# Patient Record
Sex: Female | Born: 1975 | Race: White | Hispanic: No | Smoking: Former smoker
Health system: Southern US, Community
[De-identification: ages and names within clinical notes are randomized; demographics above are authoritative.]

## PROBLEM LIST (undated history)

## (undated) DIAGNOSIS — D649 Anemia, unspecified: Secondary | ICD-10-CM

## (undated) DIAGNOSIS — E079 Disorder of thyroid, unspecified: Secondary | ICD-10-CM

## (undated) HISTORY — PX: CHOLECYSTECTOMY: SHX55

## (undated) HISTORY — DX: Anemia, unspecified: D64.9

## (undated) HISTORY — PX: NECK SURGERY: SHX720

---

## 1998-05-24 ENCOUNTER — Ambulatory Visit (HOSPITAL_COMMUNITY): Admission: RE | Admit: 1998-05-24 | Discharge: 1998-05-24 | Payer: Self-pay | Admitting: Family Medicine

## 1999-01-23 ENCOUNTER — Other Ambulatory Visit: Admission: RE | Admit: 1999-01-23 | Discharge: 1999-01-23 | Payer: Self-pay | Admitting: Family Medicine

## 1999-03-04 ENCOUNTER — Other Ambulatory Visit: Admission: RE | Admit: 1999-03-04 | Discharge: 1999-03-04 | Payer: Self-pay | Admitting: Family Medicine

## 1999-03-28 ENCOUNTER — Other Ambulatory Visit: Admission: RE | Admit: 1999-03-28 | Discharge: 1999-03-28 | Payer: Self-pay | Admitting: Obstetrics & Gynecology

## 1999-03-31 ENCOUNTER — Ambulatory Visit (HOSPITAL_COMMUNITY): Admission: RE | Admit: 1999-03-31 | Discharge: 1999-03-31 | Payer: Self-pay | Admitting: Family Medicine

## 1999-03-31 ENCOUNTER — Encounter: Payer: Self-pay | Admitting: Family Medicine

## 1999-08-15 ENCOUNTER — Inpatient Hospital Stay (HOSPITAL_COMMUNITY): Admission: AD | Admit: 1999-08-15 | Discharge: 1999-08-18 | Payer: Self-pay | Admitting: Family Medicine

## 1999-11-19 ENCOUNTER — Other Ambulatory Visit: Admission: RE | Admit: 1999-11-19 | Discharge: 1999-11-19 | Payer: Self-pay | Admitting: Obstetrics & Gynecology

## 1999-11-20 ENCOUNTER — Other Ambulatory Visit: Admission: RE | Admit: 1999-11-20 | Discharge: 1999-11-20 | Payer: Self-pay | Admitting: Obstetrics & Gynecology

## 1999-11-20 ENCOUNTER — Encounter (INDEPENDENT_AMBULATORY_CARE_PROVIDER_SITE_OTHER): Payer: Self-pay

## 2000-04-17 ENCOUNTER — Other Ambulatory Visit: Admission: RE | Admit: 2000-04-17 | Discharge: 2000-04-17 | Payer: Self-pay | Admitting: Family Medicine

## 2000-07-14 ENCOUNTER — Other Ambulatory Visit: Admission: RE | Admit: 2000-07-14 | Discharge: 2000-07-14 | Payer: Self-pay | Admitting: Obstetrics & Gynecology

## 2000-07-14 ENCOUNTER — Encounter (INDEPENDENT_AMBULATORY_CARE_PROVIDER_SITE_OTHER): Payer: Self-pay

## 2000-09-15 ENCOUNTER — Ambulatory Visit (HOSPITAL_COMMUNITY): Admission: RE | Admit: 2000-09-15 | Discharge: 2000-09-15 | Payer: Self-pay | Admitting: Obstetrics and Gynecology

## 2000-09-15 ENCOUNTER — Encounter (INDEPENDENT_AMBULATORY_CARE_PROVIDER_SITE_OTHER): Payer: Self-pay

## 2000-09-29 ENCOUNTER — Inpatient Hospital Stay (HOSPITAL_COMMUNITY): Admission: AD | Admit: 2000-09-29 | Discharge: 2000-09-29 | Payer: Self-pay | Admitting: Obstetrics and Gynecology

## 2001-02-15 ENCOUNTER — Other Ambulatory Visit: Admission: RE | Admit: 2001-02-15 | Discharge: 2001-02-15 | Payer: Self-pay | Admitting: Family Medicine

## 2001-09-27 ENCOUNTER — Other Ambulatory Visit: Admission: RE | Admit: 2001-09-27 | Discharge: 2001-09-27 | Payer: Self-pay | Admitting: Family Medicine

## 2003-02-01 ENCOUNTER — Encounter: Payer: Self-pay | Admitting: Family Medicine

## 2003-02-01 ENCOUNTER — Encounter: Admission: RE | Admit: 2003-02-01 | Discharge: 2003-02-01 | Payer: Self-pay | Admitting: Family Medicine

## 2003-02-26 ENCOUNTER — Other Ambulatory Visit: Admission: RE | Admit: 2003-02-26 | Discharge: 2003-02-26 | Payer: Self-pay | Admitting: Family Medicine

## 2003-09-13 ENCOUNTER — Ambulatory Visit (HOSPITAL_COMMUNITY): Admission: RE | Admit: 2003-09-13 | Discharge: 2003-09-13 | Payer: Self-pay | Admitting: Family Medicine

## 2003-11-22 ENCOUNTER — Other Ambulatory Visit: Admission: RE | Admit: 2003-11-22 | Discharge: 2003-11-22 | Payer: Self-pay | Admitting: Family Medicine

## 2003-12-24 ENCOUNTER — Ambulatory Visit (HOSPITAL_COMMUNITY): Admission: RE | Admit: 2003-12-24 | Discharge: 2003-12-24 | Payer: Self-pay | Admitting: Family Medicine

## 2004-04-25 ENCOUNTER — Inpatient Hospital Stay (HOSPITAL_COMMUNITY): Admission: AD | Admit: 2004-04-25 | Discharge: 2004-04-27 | Payer: Self-pay | Admitting: Family Medicine

## 2004-07-29 ENCOUNTER — Emergency Department (HOSPITAL_COMMUNITY): Admission: EM | Admit: 2004-07-29 | Discharge: 2004-07-29 | Payer: Self-pay | Admitting: Emergency Medicine

## 2004-09-09 ENCOUNTER — Emergency Department (HOSPITAL_COMMUNITY): Admission: EM | Admit: 2004-09-09 | Discharge: 2004-09-09 | Payer: Self-pay | Admitting: Emergency Medicine

## 2004-10-07 ENCOUNTER — Observation Stay (HOSPITAL_COMMUNITY): Admission: RE | Admit: 2004-10-07 | Discharge: 2004-10-08 | Payer: Self-pay | Admitting: General Surgery

## 2006-04-22 ENCOUNTER — Emergency Department (HOSPITAL_COMMUNITY): Admission: EM | Admit: 2006-04-22 | Discharge: 2006-04-22 | Payer: Self-pay | Admitting: Emergency Medicine

## 2006-10-01 ENCOUNTER — Ambulatory Visit (HOSPITAL_COMMUNITY): Admission: RE | Admit: 2006-10-01 | Discharge: 2006-10-01 | Payer: Self-pay | Admitting: Family Medicine

## 2007-04-01 ENCOUNTER — Emergency Department (HOSPITAL_COMMUNITY): Admission: EM | Admit: 2007-04-01 | Discharge: 2007-04-01 | Payer: Self-pay | Admitting: Emergency Medicine

## 2007-09-07 ENCOUNTER — Emergency Department (HOSPITAL_COMMUNITY): Admission: EM | Admit: 2007-09-07 | Discharge: 2007-09-07 | Payer: Self-pay | Admitting: Emergency Medicine

## 2008-03-25 ENCOUNTER — Emergency Department (HOSPITAL_COMMUNITY): Admission: EM | Admit: 2008-03-25 | Discharge: 2008-03-25 | Payer: Self-pay | Admitting: Emergency Medicine

## 2008-03-28 ENCOUNTER — Ambulatory Visit (HOSPITAL_COMMUNITY): Admission: RE | Admit: 2008-03-28 | Discharge: 2008-03-28 | Payer: Self-pay | Admitting: Family Medicine

## 2008-07-09 ENCOUNTER — Emergency Department (HOSPITAL_COMMUNITY): Admission: EM | Admit: 2008-07-09 | Discharge: 2008-07-09 | Payer: Self-pay | Admitting: Emergency Medicine

## 2008-07-25 ENCOUNTER — Other Ambulatory Visit: Admission: RE | Admit: 2008-07-25 | Discharge: 2008-07-25 | Payer: Self-pay | Admitting: Obstetrics and Gynecology

## 2008-08-01 ENCOUNTER — Ambulatory Visit (HOSPITAL_COMMUNITY): Admission: RE | Admit: 2008-08-01 | Discharge: 2008-08-01 | Payer: Self-pay | Admitting: Obstetrics and Gynecology

## 2008-08-07 ENCOUNTER — Ambulatory Visit (HOSPITAL_COMMUNITY): Admission: RE | Admit: 2008-08-07 | Discharge: 2008-08-07 | Payer: Self-pay | Admitting: Obstetrics and Gynecology

## 2008-09-12 ENCOUNTER — Ambulatory Visit (HOSPITAL_COMMUNITY): Admission: RE | Admit: 2008-09-12 | Discharge: 2008-09-12 | Payer: Self-pay | Admitting: Family Medicine

## 2008-10-31 ENCOUNTER — Ambulatory Visit (HOSPITAL_COMMUNITY): Admission: RE | Admit: 2008-10-31 | Discharge: 2008-10-31 | Payer: Self-pay | Admitting: Family Medicine

## 2009-01-18 ENCOUNTER — Ambulatory Visit (HOSPITAL_COMMUNITY): Admission: RE | Admit: 2009-01-18 | Discharge: 2009-01-18 | Payer: Self-pay | Admitting: Family Medicine

## 2009-01-31 ENCOUNTER — Ambulatory Visit (HOSPITAL_COMMUNITY): Admission: RE | Admit: 2009-01-31 | Discharge: 2009-01-31 | Payer: Self-pay | Admitting: Family Medicine

## 2009-05-13 ENCOUNTER — Inpatient Hospital Stay (HOSPITAL_COMMUNITY): Admission: RE | Admit: 2009-05-13 | Discharge: 2009-05-14 | Payer: Self-pay | Admitting: Neurosurgery

## 2009-06-12 ENCOUNTER — Emergency Department (HOSPITAL_COMMUNITY): Admission: EM | Admit: 2009-06-12 | Discharge: 2009-06-12 | Payer: Self-pay | Admitting: Emergency Medicine

## 2009-08-17 HISTORY — PX: TUBAL LIGATION: SHX77
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
## 2010-11-20 LAB — COMPREHENSIVE METABOLIC PANEL
ALT: 14 U/L (ref 0–35)
Alkaline Phosphatase: 80 U/L (ref 39–117)
CO2: 26 mEq/L (ref 19–32)
GFR calc non Af Amer: >60 mL/min (ref >60)
Glucose, Bld: 103 mg/dL — ABNORMAL HIGH (ref 70–99)
Potassium: 3.6 mEq/L (ref 3.5–5.1)
Sodium: 138 mEq/L (ref 135–145)

## 2010-11-20 LAB — CBC
HCT: 38.5 % (ref 36.0–46.0)
Hemoglobin: 13.6 g/dL (ref 12.0–15.0)
MCV: 90.3 fL (ref 78.0–100.0)
Platelets: 226 10*3/uL (ref 150–400)
RBC: 4.27 MIL/uL (ref 3.87–5.11)

## 2010-11-20 LAB — GC/CHLAMYDIA PROBE AMP, GENITAL
Chlamydia, DNA Probe: NEGATIVE
GC Probe Amp, Genital: NEGATIVE

## 2010-11-20 LAB — URINALYSIS, ROUTINE W REFLEX MICROSCOPIC
Ketones, ur: 15 mg/dL — AB
Specific Gravity, Urine: 1.025 (ref 1.005–1.030)

## 2010-11-20 LAB — DIFFERENTIAL
Basophils Relative: 1 % (ref 0–1)
Eosinophils Absolute: 0 10*3/uL (ref 0.0–0.7)
Neutrophils Relative %: 61 % (ref 43–77)

## 2010-11-20 LAB — URINE MICROSCOPIC-ADD ON

## 2010-11-20 LAB — WET PREP, GENITAL

## 2010-11-21 LAB — BASIC METABOLIC PANEL
BUN: 7 mg/dL (ref 6–23)
Calcium: 9 mg/dL (ref 8.4–10.5)
GFR calc non Af Amer: >60 mL/min (ref >60)
Glucose, Bld: 97 mg/dL (ref 70–99)
Potassium: 3.7 mEq/L (ref 3.5–5.1)

## 2010-11-21 LAB — CBC
HCT: 38.1 % (ref 36.0–46.0)
MCHC: 34.3 g/dL (ref 30.0–36.0)
Platelets: 250 10*3/uL (ref 150–400)
RDW: 12.4 % (ref 11.5–15.5)

## 2010-12-30 NOTE — H&P (Signed)
NAME:  Donna Lynch, Donna Lynch NO.:  [id number]   MEDICAL RECORD NO.:  [id number]          PATIENT TYPE:  AMB   LOCATION:  DAY                           FACILITY:  [facility]   PHYSICIAN:  Tilda Burrow, M.D. DATE OF BIRTH:  1976/01/30   DATE OF ADMISSION:  DATE OF DISCHARGE:  LH                              HISTORY & PHYSICAL   ADMISSION DIAGNOSIS:  Pregnancy, [redacted] weeks gestation, missed abortion.   HISTORY OF PRESENT ILLNESS:  This 35 year old female gravida 5, para 3-0-  1-2, LMP 05/25/2008 placing EDC 02/19/2009 is seen for suction D&C and  tubal ligation on 08/02/2008.  Vianny has been seen in our office for  evaluation of an early unplanned pregnancy.  Ultrasound showed a  nonviable pregnancy with an intrauterine gestational sac.  The patient  was given the option of continued followup until bleeding begins and  hopeful spontaneous passage of tissue.  Options of proceeding with a D&C  are similarly discussed.  The patient additionally reports that she  desires to proceed with permanent sterilization.  She is insured and at  78 is informed of the permanency of requesting the procedure and desires  to proceed at this time.  Plans are to proceed with suction dilation and  curettage followed by laparoscopic tubal sterilization with Falope  rings, both to be performed Thursday, 08/02/2008 at 10:30 a.m.   PAST MEDICAL HISTORY:  Positive for heart palpitations at age 40, not  currently a problem.   PAST SURGICAL HISTORY:  Cholecystectomy.   ALLERGIES:  Penicillin causes an unknown reaction.   MEDICATIONS:  None.   HABITS:  Cigarettes, 6 per day.  Alcohol denied.  Recreational drugs  denied.   The patient is employed at [employer]   FAMILY HISTORY:  Positive for diabetes and ovarian cancer.   GYN HISTORY AND OB HISTORY:  She is a gravida 5, para 3-0-1-2 who one  child who died at age 35 from drowning with two term vaginal deliveries  at [facility] in 2000 and  2005.  She desires no further children.   PHYSICAL EXAMINATION:  Reveals a petite Caucasian female, alert and  oriented times three.  Height 5 feet 1 inches, weight 154, blood pressure 130/60, pulse 70s.  HEENT:  Pupils equal, round, and reactive.  NECK:  Supple.  CHEST:  Clear to auscultation.  ABDOMEN:  Nontender.  External genitalia:  Multiparous.  Uterus nontender, anteflexed, 6 to 8  week size with ultrasound confirmation of an intrauterine gestational  sac and absence of fetal heart motion.   PLAN:  Suction dilation and curettage to be performed on 08/02/2008.   Addendum: Due to suspected Bronchitis, patient is postponed for 5 days  and given oral antibiotics, Azithromycin.    Patient report , on 08/06/2008, that she is better and not coughing  any more. CXR was normal last week.      Tilda Burrow, M.D.  Electronically Signed     JVF/MEDQ  D:  08/01/2008  T:  08/01/2008  Job:  [id number]   cc:   [facility]

## 2010-12-30 NOTE — Op Note (Signed)
NAME:  Donna Lynch, Donna Lynch NO.:  [id number]   MEDICAL RECORD NO.:  [id number]          PATIENT TYPE:  AMB   LOCATION:  DAY                           FACILITY:  [facility]   PHYSICIAN:  Tilda Burrow, M.D. DATE OF BIRTH:  1976-05-03   DATE OF PROCEDURE:  DATE OF DISCHARGE:                               OPERATIVE REPORT   PREOPERATIVE DIAGNOSES:  1. Missed abortion.  2. Desire for elective sterilization.   POSTOPERATIVE DIAGNOSES:  1. Missed abortion.  2. Desire for elective sterilization.  3. Umbilical hernia.   PROCEDURES:  1. Suction dilation and curettage.  2. Laparoscopic tubal sterilization, Falope ring.  3. Umbilical herniorrhaphy.   SURGEON:  Tilda Burrow, MD   ASSISTANT:  Amie Critchley, CST   ANESTHESIA:  General with endotracheal intubation./Theresa Ophelia Charter, CRNA.   COMPLICATIONS:  None.   FINDINGS:  Diffusely enlarged uterus consistent with fibroid changes in  the uterus.  Empty sac and placenta consistent with missed AB 3, 1.5 cm  asymptomatic umbilical hernia, wanting repair as part of the surgical  procedure.   DETAILS OF THE PROCEDURE:  The patient was taken to the operating room,  prepped and draped in the usual fashion for a combined abdominal and  vaginal procedure.  First portion of the procedure was the [facility].   With legs in the low-lithotomy position, the speculum was inserted and  vaginal apex prepped using Betadine.  Anterior lip of the cervix was  grasped with single-tooth tenaculum and paracervical block using 18 mL  of Marcaine solution was performed.  The uterus was then sounded to 12  cm.  Preprocedure examination while the patient was asleep had shown the  uterus to be diffusely enlarged and well supported, consistent with 10-  week size.  Uterus was sounded in the anteflexed position to 11 cm,  dilated to 31 [place] allowing introduction through the curved 10-mm  suction curette, which was placed to 45 mm suction and removed fluid  tissue, all clots, and products of conception.  IV oxytocin was infused  to encourage uterine contraction.  A sharp curettage of all 4 quadrants  confirmed a large firm, thick uterus with satisfactory evacuation of the  endometrial cavity.  Repeat suction curettage confirmed that the  endometrial cavity was empty.  The specimen was sent to Pathology.   TUBAL LIGATION:  We then prepped for tubal ligation.  A left uterine  manipulator was attached to the cervix for uterine manipulation and in-  and-out catheterization of the bladder was performed sterilely.  Abdomen  was prepped and draped.  A mid umbilical 1-cm skin incision was made  after we could feel the umbilicus and tell that there was a 1.5-cm  umbilical hernia with a firm rim.  The edge of the fascia could be  elevated with an Allis clamp and the remaining preperitoneal fat and  peritoneum were easily entered with a Veress needle and pneumoperitoneum  achieved under 4 mm mercury of pressure.  Attention was then directed to  the pelvis using laparoscopic camera and the suprapubic trocar was  introduced under direct visualization.  The attention was directed to  the pelvis and the uterus was firm, diffusely enlarged, consistent with  fibroid enlargement of the uterus.  There was no endometriosis.  No  ovarian abnormalities appreciable.  Tubes were grossly normal.  The left  tube could be identified to its fimbriated end, was elevated, and the  Falope-ring applicator used to attach a ring to the mid portion of the  left fallopian tube with the area around the ring infiltrated  percutaneously with Marcaine solution.  The opposite tube was treated in  similar fashion and photo documentation performed.  Deflation of the  abdomen followed.  A 60 mL of saline was placed in the abdomen to help  with evacuation of carbon dioxide.   UMBILICAL HERNIA REPAIR:  Umbilical hernia repair consisted of grasping  the fascial edges above and below the  umbilicus, and pulling together  with 3 sutures of 2-0 Prolene tied in an interrupted fashion with the  knot buried beneath the fascia.  The patient tolerated the procedure  well.  The subcu closure of the skin incision then followed with  subcuticular 4-0 Dexon interrupted sutures x3 for the umbilicus and x1  in the suprapubic site.  Sponge and needle counts were correct.  The  patient went to recovery room in stable condition.      Tilda Burrow, M.D.  Electronically Signed     JVF/MEDQ  D:  08/07/2008  T:  08/08/2008  Job:  [id number]   cc:   Philipp Ovens OBN/GYN

## 2011-01-02 NOTE — Op Note (Signed)
[facility] [place]  Patient:    Donna Lynch, Donna Lynch                        MRN: [id number] Proc. Date: 09/15/00 Adm. Date:  [id number] Attending:  Dierdre Forth Pearline                           Operative Report  PREOPERATIVE DIAGNOSIS:       High grade squamous intraepithelial lesion.  POSTOPERATIVE DIAGNOSIS:      High grade squamous intraepithelial lesion.  OPERATION:                    Loop electrosurgical excision procedure of the                               cervix.  SURGEON:                      Vanessa P. Pennie Rushing, M.D.  ANESTHESIA:                   Local.  ESTIMATED BLOOD LOSS:         Less than 100 cc.  COMPLICATIONS:                None.  FINDINGS:                     There was a large Lugols nonstaining area in the transition zone which was excised at the time of LEEP conization.  DESCRIPTION OF PROCEDURE:     The patient was taken to the operating room and placed on the operating table. She was placed in the lithotomy position. The perineum was prepped with multiple layers of Betadine and draped. A laser speculum was placed in the vagina and colposcopy performed. A paracervical block was achieved first with a total of 10 cc of 2% Xylocaine in the 5 and 7 oclock positions, but when no relief of pain was noted, another 10 cc of 2% Xylocaine was placed in the same positions. After waiting an appropriate period of time, it was noted the patient still did not have adequate relief and she was then given a local with a total of 10 cc 0.25% Marcaine with epinephrine which allowed for good relief. The cervix was then infiltrated with Pitressin. A 20 x 10 loop was used to excised a cone-shaped portion of the transition zone. This was pinned on a corkboard. Hemostasis was achieved with several sutures of 0 Vicryl and cautery to the conization bed. A gel foam pack was placed in the conization bed and hemostasis noted to be adequate. The patient was  taken from the operating room to the recovery room is satisfactory condition having tolerated the procedure well with sponge and instrument counts correct.  SPECIMEN TO PATHOLOGY:        LEEP conization sent fresh. DD:  09/15/00 TD:  09/15/00 Job: [id number]

## 2011-01-02 NOTE — Op Note (Signed)
[facility] [place]  Patient:    Donna Lynch, Donna Lynch                        MRN: [id number] Proc. Date: 09/29/00 Adm. Date:  [id number] Disc. Date: [id number] Attending:  Dierdre Forth Pearline                           Operative Report  PREOPERATIVE DIAGNOSES:       1. Status post loop electrosurgical excision                                  procedure on September 15, 2000.                               2. Cervical bleeding.  POSTOPERATIVE DIAGNOSES:      1. Status post loop electrosurgical excision                                  procedure on September 15, 2000.                               2. Cervical bleeding.  PROCEDURE:                    Multiple suturing of the cervix.  SURGEON:                      Janine Limbo, M.D.  ANESTHESIA:                   Local Xylocaine.  INDICATIONS:                  The patient is a 35 year old female, para 3-0-1-3 who underwent a LEEP procedure on September 15, 2000 because of a high grade squamous intraepithelial lesion.  The patient tolerated her procedure well.  The patient was doing quite well and, in fact, was seen in the office on September 29, 2000 for a postoperative visit.  At that point, she was having to problems whatsoever.  The patient reports that she did have a headache today and that she took "Goodys" powders (contains aspirin).  She began having heavy vaginal bleeding with clots.  She presented to the emergency department because of her bleeding.  She reports that she has had some slight dizziness.  FINDINGS:                     The patient was noted to have significant bleeding from the 7 oclock position of the cervix.  There was no evidence of infection.  Her hemoglobin was 12.5.  Her white blood cell count was 5600. Orthostatic blood pressures and pulses were obtained and they were largely within normal limits except that her pulse did increase from 98 lying to 104 sitting to 136  standing.  DESCRIPTION OF PROCEDURE:     The patient was seen in the emergency department and she was examined.  She was placed in the lithotomy position.  The cervix was carefully inspected and she was noted to have bleeding at the 7 oclock position.  The cervix was injected with a total  of about 30 cc of a% Xylocaine without epinephrine.  A total of four 0 Vicryl sutures were placed in the cervix.  Pressure was placed on the cervix and Gelfoam was also placed in the crater from where the leak was performed.  Pressure was then able to maintain hemostasis.  The patient was able to tolerate her procedure well.  Estimated blood loss was 75 cc.  She was returned to the supine position.  FOLLOW-UP INFORMATION:        The patient will return in 1-2 weeks for follow-up examination.  She was told to expect some spotting, but to call fro bleeding and clotting.  She will be observed in the emergency department and she will be given food.  If she is stable, then she will be allowed to go home.  Otherwise, she will be observed overnight. DD:  09/29/00 TD:  09/30/00 Job: [id number]

## 2011-01-02 NOTE — H&P (Signed)
Donna Lynch, Donna Lynch        ACCOUNT NO.:  [id number]   MEDICAL RECORD NO.:  [id number]          PATIENT TYPE:  INP   LOCATION:  A330                          FACILITY:  [facility]   PHYSICIAN:  Dirk Dress. Katrinka Blazing, M.D.   DATE OF BIRTH:  [birth year]   DATE OF ADMISSION:  DATE OF DISCHARGE:  LH                                HISTORY & PHYSICAL   HISTORY OF PRESENT ILLNESS:  A 35 year old female with a 3-month history of  general malaise with epigastric and back pain, with episodic nausea and  vomiting.  The patient was symptomatic basically every day.  During her work-  up, she was found to have numerous gallstones filling her entire  gallbladder.  She remains severely symptomatic, and is having uncontrolled  pain.  She is scheduled for laparoscopic cholecystectomy.   PAST HISTORY:   MEDICATIONS:  She is not on any medications.   PAST SURGICAL HISTORY:  She has not had previous surgery.   ALLERGIES:  PENICILLIN.   SOCIAL HISTORY:  She is married.  She is employed.  She has 3 children.   REVIEW OF SYSTEMS:  Positive for constant bleeding vaginally as a result of  Depo-Provera.   PHYSICAL EXAMINATION:  VITAL SIGNS:  Blood pressure 110/60, pulse 80,  respirations 18, weight 146 pounds.  HEENT:  Unremarkable.  NECK:  Supple without JVD or bruit.  CHEST:  Clear to auscultation.  HEART:  Regular rate and rhythm without murmur, gallop, or rub.  ABDOMEN:  Soft.  There is epigastric tenderness and left lower quadrant  tenderness.  No masses.  EXTREMITIES:  No clubbing, cyanosis, or edema.  NEUROLOGIC:  No focal motor, sensory, or cerebellar deficits.   IMPRESSION:  Cholelithiasis with cholecystitis.   PLAN:  Laparoscopic cholecystectomy.      LCS/MEDQ  D:  10/06/2004  T:  10/07/2004  Job:  [id number]

## 2011-01-02 NOTE — Op Note (Signed)
NAME:  Donna Lynch, Donna Lynch        ACCOUNT NO.:  [id number]   MEDICAL RECORD NO.:  [id number]          PATIENT TYPE:  AMB   LOCATION:  DAY                           FACILITY:  [facility]   PHYSICIAN:  Jerolyn Shin C. Katrinka Blazing, M.D.   DATE OF BIRTH:  09-11-1975   DATE OF PROCEDURE:  10/07/2004  DATE OF DISCHARGE:                                 OPERATIVE REPORT   PREOPERATIVE DIAGNOSIS:  Cholelithiasis, cholecystitis.   POSTOPERATIVE DIAGNOSIS:  Cholelithiasis, cholecystitis.   PROCEDURE:  Laparoscopic cholecystectomy.   SURGEON:  Dirk Dress. Katrinka Blazing, M.D.   DESCRIPTION:  Under general anesthesia, the patient's abdomen was prepped  and draped in a sterile field.  A supraumbilical midline incision was made.  The Veress needle was inserted uneventfully.  Abdomen was insufflated with  2.5 L of CO2.  Using a Visiport guide, a 10 mm port was placed.  The  laparoscope was placed.  Under videoscopic guidance, a 10 mm port with two 5  mm ports were placed in the right subcostal region.  The gallbladder was  grasped and placed on traction.  The cystic duct was dissected, clipped with  five clips and divided.  The cystic artery had two branches.  Each was  clipped with three clips and divided close to the gallbladder.  Using  electrocautery, the gallbladder was separated from the hepatic bed without  difficulty.  It was placed in an EndoCatch device and retrieved.  Irrigation  was carried out until the fluid returned clear.  There was no bleeding from  the bed of the liver.  The scope was removed from the supraumbilical area  and placed in the midsubcostal area.  Evaluation of the left colon and the  left lower quadrant revealed no abnormality except for some hard stool in  the colon.  Pelvic organs appeared to be normal, especially the left tube  and ovary.  The right tube and ovary also appeared to be normal, as was the  uterus.  There were no adhesions in the pelvis.  There was no evidence of  inflammation.   The CO2 was allowed to escape from the abdomen and the ports  were removed.  The fascia of the supraumbilical incision was closed with 0  Vicryl.  All incisions were closed with staples on the skin.  The patient  tolerated the procedure well.  Dressings were placed.  She was awakened from  anesthesia uneventfully and transferred to a bed and taken to the  postanesthesic care unit for further monitoring.      LCS/MEDQ  D:  10/07/2004  T:  10/07/2004  Job:  [id number]

## 2011-05-22 LAB — CBC
HCT: 35.6 % — ABNORMAL LOW (ref 36.0–46.0)
MCHC: 35 g/dL (ref 30.0–36.0)
MCV: 93 fL (ref 78.0–100.0)
RBC: 3.83 MIL/uL — ABNORMAL LOW (ref 3.87–5.11)

## 2011-05-22 LAB — RHOGAM INJECTION

## 2018-04-19 ENCOUNTER — Other Ambulatory Visit (HOSPITAL_COMMUNITY): Payer: Self-pay | Admitting: *Deleted

## 2018-04-19 DIAGNOSIS — Z1231 Encounter for screening mammogram for malignant neoplasm of breast: Secondary | ICD-10-CM

## 2018-04-25 ENCOUNTER — Encounter (HOSPITAL_COMMUNITY): Payer: Self-pay

## 2018-04-25 ENCOUNTER — Ambulatory Visit (HOSPITAL_COMMUNITY)
Admission: RE | Admit: 2018-04-25 | Discharge: 2018-04-25 | Disposition: A | Discharge status: Home / Self Care | Payer: PRIVATE HEALTH INSURANCE | Source: Ambulatory Visit | Attending: *Deleted | Admitting: *Deleted

## 2018-04-25 DIAGNOSIS — Z1231 Encounter for screening mammogram for malignant neoplasm of breast: Secondary | ICD-10-CM | POA: Insufficient documentation

## 2018-08-17 DIAGNOSIS — E039 Hypothyroidism, unspecified: Secondary | ICD-10-CM

## 2018-08-17 HISTORY — DX: Hypothyroidism, unspecified: E03.9

## 2019-12-15 ENCOUNTER — Other Ambulatory Visit: Payer: Self-pay

## 2019-12-15 ENCOUNTER — Telehealth: Payer: Self-pay

## 2019-12-15 ENCOUNTER — Ambulatory Visit
Admission: EM | Admit: 2019-12-15 | Discharge: 2019-12-15 | Disposition: A | Discharge status: Home / Self Care | Payer: BC Managed Care – PPO

## 2019-12-15 DIAGNOSIS — M79671 Pain in right foot: Secondary | ICD-10-CM | POA: Diagnosis not present

## 2019-12-15 DIAGNOSIS — Z0289 Encounter for other administrative examinations: Secondary | ICD-10-CM

## 2019-12-15 DIAGNOSIS — M79672 Pain in left foot: Secondary | ICD-10-CM | POA: Diagnosis not present

## 2019-12-15 HISTORY — DX: Disorder of thyroid, unspecified: E07.9

## 2019-12-15 MED ORDER — PREDNISONE 20 MG PO TABS: 20.0000 mg | ORAL_TABLET | Freq: Two times a day (BID) | ORAL | 0 refills | Status: AC

## 2019-12-15 MED ORDER — PREDNISONE 20 MG PO TABS: 20.0000 mg | ORAL_TABLET | Freq: Two times a day (BID) | ORAL | 0 refills | Status: DC

## 2019-12-15 NOTE — Discharge Instructions (Signed)
Work note given Continue conservative management of rest, ice, and gentle stretches; try freezing a water bottle and stretching bottle of foot over water bottle Prednisone prescribed.  Take as directed and to completion Follow up with PCP or orthopedist for further evaluation and management Return or go to the ER if you have any new or worsening symptoms (fever, chills, redness, swelling, bruising, worsening symptoms despite medication/ treatment, etc...)

## 2019-12-15 NOTE — ED Provider Notes (Signed)
[facility]   WS:[id number] 12/15/19 Arrival Time: 0904  CC: Encounter to obtain work note  SUBJECTIVE: History from: patient. Donna HOUSEN is a 44 y.o. female complains of bilateral foot pain x 3 months.  Denies a precipitating event or specific injury.  Reports standing on her feet for 11 hours at time.  Localizes the pain to the heels.  Describes the pain as intermittent and achy in character.  Has tried OTC medications without relief.  Symptoms are made worse with standing.  Denies similar symptoms in the past.  Denies fever, chills, erythema, ecchymosis, effusion, weakness.    ROS: As per HPI.  All other pertinent ROS negative.     Past Medical History:  Diagnosis Date  . Thyroid disease    History reviewed. No pertinent surgical history. Allergies  Allergen Reactions  . Penicillins    No current facility-administered medications on file prior to encounter.   Current Outpatient Medications on File Prior to Encounter  Medication Sig Dispense Refill  . cholecalciferol (VITAMIN D3) 25 MCG (1000 UNIT) tablet Take 1,000 Units by mouth daily.    . ferrous sulfate 325 (65 FE) MG EC tablet Take 325 mg by mouth 3 (three) times daily with meals.    Marland Kitchen levothyroxine (SYNTHROID) 100 MCG tablet Take 100 mcg by mouth daily before breakfast.     Social History   Socioeconomic History  . Marital status: Legally Separated    Spouse name: Not on file  . Number of children: Not on file  . Years of education: Not on file  . Highest education level: Not on file  Occupational History  . Not on file  Tobacco Use  . Smoking status: Former Research scientist (life sciences)  . Smokeless tobacco: Never Used  Substance and Sexual Activity  . Alcohol use: Yes    Comment: socially  . Drug use: Never  . Sexual activity: Not on file  Other Topics Concern  . Not on file  Social History Narrative  . Not on file   Social Determinants of Health   Financial Resource Strain:   . Difficulty of Paying Living  Expenses:   Food Insecurity:   . Worried About Charity fundraiser in the Last Year:   . Arboriculturist in the Last Year:   Transportation Needs:   . Film/video editor (Medical):   Marland Kitchen Lack of Transportation (Non-Medical):   Physical Activity:   . Days of Exercise per Week:   . Minutes of Exercise per Session:   Stress:   . Feeling of Stress :   Social Connections:   . Frequency of Communication with Friends and Family:   . Frequency of Social Gatherings with Friends and Family:   . Attends Religious Services:   . Active Member of Clubs or Organizations:   . Attends Archivist Meetings:   Marland Kitchen Marital Status:   Intimate Partner Violence:   . Fear of Current or Ex-Partner:   . Emotionally Abused:   Marland Kitchen Physically Abused:   . Sexually Abused:    Family History  Problem Relation Age of Onset  . Healthy Mother   . Healthy Father     OBJECTIVE:  Vitals:   12/15/19 0917  BP: 128/87  Pulse: 78  Resp: 19  Temp: 98.4 F (36.9 C)  SpO2: 97%    General appearance: ALERT; in no acute distress.  Head: NCAT Lungs: Normal respiratory effort CV: Dorsalis pedis pulses 2+ bilaterally. Cap refill < 2 seconds Musculoskeletal:  Bilateral feet Inspection: Skin warm, dry, clear and intact without obvious erythema, effusion, or ecchymosis.  Palpation: Mild diffuse TTP over bilateral heels ROM: FROM active and passive Skin: warm and dry Neurologic: Ambulates without difficulty; Sensation intact about the lower extremities Psychological: alert and cooperative; normal mood and affect  ASSESSMENT & PLAN:  1. Bilateral foot pain   2. Encounter to obtain excuse from work     Meds ordered this encounter  Medications  . predniSONE (DELTASONE) 20 MG tablet    Sig: Take 1 tablet (20 mg total) by mouth 2 (two) times daily with a meal for 5 days.    Dispense:  10 tablet    Refill:  0    Order Specific Question:   Supervising Provider    Answer:   Raylene Everts [id number]    Work note given Continue conservative management of rest, ice, and gentle stretches; try freezing a water bottle and stretching bottle of foot over water bottle Prednisone prescribed.  Take as directed and to completion Follow up with PCP or orthopedist for further evaluation and management Return or go to the ER if you have any new or worsening symptoms (fever, chills, redness, swelling, bruising, worsening symptoms despite medication/ treatment, etc...)     Reviewed expectations re: course of current medical issues. Questions answered. Outlined signs and symptoms indicating need for more acute intervention. Patient verbalized understanding. After Visit Summary given.    Lestine Box, PA-C 12/15/19 [phone]

## 2019-12-15 NOTE — ED Triage Notes (Signed)
Pt presents with complaints of feet pain x 3 months that is worsening over time. Reports having a job where she is on her feet for 11 hours. States she is having pain other places in her body like in her wrists. States she stopped taking her vitamin d and is unsure if that's why. Pt is asking for a dr note until she can get in to see her pcp.

## 2020-05-01 ENCOUNTER — Telehealth: Payer: Self-pay | Admitting: Allergy & Immunology

## 2020-05-01 NOTE — Telephone Encounter (Signed)
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 Patient had tingling in her tongue 20 minutes after getting the shot. 10 minutes later her throat had pressure like it was closing up. Patient was giving Benadryl and was fine other than a headache. Patient says she has reactions after pineapple and wine and she has had the same reaction since the shot of red cheeks and small bumps on her cheeks.   Please advise if covid component testing is needed.

## 2020-05-01 NOTE — Telephone Encounter (Signed)
I do not think that component testing is needed.  I would recommend that she premedicate with Zyrtec 1 to 2 tablets 30 minutes before the next injection.  If she is very nervous, we can do the Covid vaccine in our office.  We are doing a Covid shot clinic in [place] on September 29.  Salvatore Marvel, MD [facility] [place]

## 2020-05-01 NOTE — Telephone Encounter (Signed)
Left a message for patient to call the office in regards to this matter. 

## 2020-05-01 NOTE — Telephone Encounter (Signed)
Please advise 

## 2020-05-02 NOTE — Telephone Encounter (Signed)
Please advice Dr. Ernst Bowler

## 2020-05-02 NOTE — Telephone Encounter (Signed)
Patient called back and was informed of Dr. Gillermina Hu recommendation. Patient verbalized understanding and was okay with it. Patient wanted to inform that her cheeks are still red with bumps on them like she is still having an allergic reaction (its been 7 days since first shot). Patient said when she drinks wine or Sundrop her cheeks get red and bumpy like that, but it goes away after a few hours.  Please advise.

## 2020-05-03 NOTE — Telephone Encounter (Signed)
Left message for patient to call back and advise we would like to receive a picture of her reactions so that we can decide what medication to send to her specific pharmacy.

## 2020-05-03 NOTE — Telephone Encounter (Signed)
Can she send pictures?  We could send in something like Eucrisa or hydrocortisone 2.5% to apply to see if that helps.  Salvatore Marvel, MD [facility] [place]

## 2020-05-08 NOTE — Telephone Encounter (Signed)
Attempted to call patient again and number is no longer in service.... Patient will need to schedule a new patient appointment for further evaluation and treatments.

## 2020-05-17 ENCOUNTER — Telehealth: Payer: Self-pay | Admitting: Allergy

## 2020-05-17 NOTE — Telephone Encounter (Signed)
Scheduled new pt appt with Dr Maudie Mercury on 10/12 in [place]

## 2020-05-17 NOTE — Telephone Encounter (Signed)
She needs a new pt appointment with based on the info provided most likely covid vaccine component testing.  I wonder why waited until today, the day she was due for her 2nd dose to address this.

## 2020-05-17 NOTE — Telephone Encounter (Signed)
PT called back and we schedule new pt appt for 10/28 at 9am but this is the deadline date for [employer] employees to get vaccinated. Pt did address this issue with dr gallgher via the 9/15 telephone contact thread. States Dr gallagher only told her to try benadryl. PT would like to be seen sooner and does not want to see Dr gallagher. Would prefer Dr Nelva Bush but for sooner appt please.

## 2020-05-17 NOTE — Telephone Encounter (Signed)
Please advise 

## 2020-05-17 NOTE — Telephone Encounter (Signed)
PT is a [employer] employee who had a reaction to 1st pfizer on 9/10. 1st symptoms appeared within 30 mins were tongue tingling, high bp, throat closing. Administered benadryl which helped symptoms within 30 mins. Also had swelling on whole upper arm. Week later, experiencing numbness and tingling on whole arm. Due for next [place] today 10/1 but was told to contact us for evaluation.

## 2020-05-29 NOTE — Progress Notes (Signed)
New Patient Note  RE: Donna Lynch MRN: [id number] DOB: 01/20/1976 Date of Office Visit: 05/30/2020  Referring provider: No ref. provider found Primary care provider: Raiford Simmonds., PA-C  Chief Complaint: Allergic Reaction  History of Present Illness: I had the pleasure of seeing Donna Lynch for initial evaluation at the [facility] on 05/30/2020. She is a 44 y.o. female, who is self-referred here for the evaluation of possible vaccine reaction.  Patient received her first [place] vaccine on 04/26/2020 in the morning at [facility].   Patient got her injection on the left arm. She was drinking tea while waiting and noticed some tongue tingling about 20 minutes afterwards.  She noticed some difficulty breathing as well. She was taken to a room for evaluation.They took her blood pressure which was elevated. She took some benadryl there and the symptoms improved after about 1 hour.   She noticed breaking out in a facial rash - describes it as bumpy and prickly since then. Not sure of exact onset but this typically happened in the past when she consumed pineapples and wine. Denies it being pruritic however is worried as the rash persisted without any improvement. With the pineapple and wine usually the rash resolves after a few hours.  The following day patient had some left upper arm swelling and erythema which resolved within a few days.  Patient noticed some "electrical shock" on the right arm 1 week afterwards which is still happening.   Patient had no prior Covid-19 infection.   Patient went to get her second vaccine when she was due and apparently she was told to contact our office for further evaluation.    Any known reactions to polyethylene glycol or polysorbate?  No.  Any history of anaphylaxis to vaccinations? Patient had no reactions to past vaccinations.   Any history of reactions to injectable medications? Patient had no reactions to previous  steroid injection about 5 years ago.   Any history of anaphylaxis to colonoscopy preps (i.e.Miralax)? No prior exposure to Miralax or colonoscopy.   Any history of dermal filler treatments in the last year? No.   05/01/2020 phone encounter: "Patient is a [employer] employee and was directed to be seen before getting her second shot due to a reaction to the first shot a week ago. Patient had tingling in her tongue 20 minutes after getting the shot. 10 minutes later her throat had pressure like it was closing up. Patient was giving Benadryl and was fine other than a headache. Patient says she has reactions after pineapple and wine and she has had the same reaction since the shot of red cheeks and small bumps on her cheeks."  Patient has a thyroid issue but not on synthroid anymore. She does not have a current PCP as she was not fond of her prior PCP and is in the process of finding a new PCP.   Assessment and Plan: Donna Lynch is a 44 y.o. 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 Symptoms resolved after benadryl. Had some associated localized arm swelling which resolved. However, now has persistent facial rash. She had this type of rash before after drinking wine or pineapple which usually resolve after a few hours. Concerned about receiving her second injection due these symptoms. History of thyroid issues but not on synthroid anymore and does not have current PCP.   Reviewed at length with  patient the difference between adverse side effects versus allergic reactions to vaccines. Based on her clinical history she had some adverse side effects but not IgE-mediated allergic reaction. I doubt the facial rash is due to the vaccine.   She underwent Covid-19 component testing which was all negative.  Today's skin prick and intradermal testing was negative to Miralax (source of PEG  3350), methylprednisolone acetate (source of PEG 3350), and triamcinolone acetonide (source of polysorbate-80).  Offered patient either the J&J vaccine or the second Pfizer vaccine in our office today but she is hesitant and would like to think about this some more.  Discussed that the risk of blood clots with the J&J is extremely low and there is a higher chance of getting blood clots from COVID-19 infection in unvaccinated people.  Patient does not meet criteria for medical exemption based on above clinical history and testing results.    Provided patient with dates of our vaccine clinics - she may get at our office or at [facility].   Recommendation: Take zyrtec 10mg  1-2 hour before the injection and wait 30 minutes after the injection.  Drug reaction Penicillin caused lip swelling as a child as per her grandmother.   Continue to avoid.  Consider penicillin testing in future. Over 90% outgrow penicillin allergy.  Rash and other nonspecific skin eruption Persistent facial rash which she says it's not bothersome at this time.   See below for proper skin care.   Recommend establish care with PCP.  Return if symptoms worsen or fail to improve.   Recommend to establish care with PCP to check on your thyroid levels.  Other allergy screening: Asthma: no Rhino conjunctivitis: no Food allergy:   Pineapple, wine causes facial rash Ghost peppers - throat closure? Medication allergy: yes  Penicillin - lip swelling as a child.  Hymenoptera allergy: no Urticaria: no Eczema:no History of recurrent infections suggestive of immunodeficency: no  Diagnostics: Skin Testing: covid-19 component panel. Today's skin prick and intradermal testing was negative to Miralax (source of PEG 3350), methylprednisolone acetate (source of PEG 3350), and triamcinolone acetonide (source of polysorbate-80).  Results discussed with patient/family.  COVID Vaccine Testing - 05/30/20 1012      Test  Information   Consent Yes    Medications Triamcinolone    Triamcinolone Lot # [id number]    Triamcinolone EXP DATE 07/08/20      Pre Test Vitals   BP 110/78    Pulse 67    Resp 16      SKIN PRICK TESTING - Arm #1   Location Right Arm    Select Select      HISTAMINE (1mg /mL) Skin Prick Arm #1   Histamine Time Testing Placed 1003    Histamine Wheal 2+      Control (negative - HSA) Skin Prick Arm #1   Control Time Testing Placed 1003    Control Wheal Negative      Triamcinolone (40mg /mL) Skin Prick Arm #1   Triamcinolone Time Testing Placed 1003    Triamcinolone Wheal Negative      Methylprednisolone (40mg /mL) Skin Prick Arm #1   Methylprednisolone Time Testing Placed 1003    Methylprednisolone Wheal Negative      Miralax (1:100 or 1.7 mg/mL) Skin Prick Arm #1   Miralax Time Testing Placed 1003    Miralax Wheal Negative      Miralax (1:10 or 17mg /mL) Skin Prick Arm #1   Miralax Time Testing Placed 1037    Miralax Wheal Negative  Miralax (1:1 or 170mg /mL) Skin Prick Arm #1   Miralax Time Testing Placed 1104    Miralax Wheal Negative      INTRADERMAL TESTING - Arm #2   Location Left Arm    Select Select      Control (negative - HSA) Intradermal Arm #2   Control Time Testing Placed  1037    Control Wheal Negative      Triamcinolone (1:100) Intradermal Arm #2   Triamcinolone Time Testing Placed 1037    Triamcinolone Wheal Negative      Methylprednisolone (1:100) Intradermal Arm #2   Methylprednisolone Time Testing Placed  1037    Methylprednisolone Wheal Negative      Triamcinolone (1:10) Intradermal Arm #2   Triamcinolone Time Testing Placed 1104    Triamcinolone Wheal Negative      Methylprednisolone (1:10) Intradermal Arm #2   Methylprednisolone Time Testing Placed  1104    Methylprednisolone Wheal Negative      Triamcinolone (1:1) Intradermal Arm #2   Triamcinolone Time Testing Placed 1125    Triamcinolone Wheal Negative      Skin Prick/Intradermal  Post Testing   Skin Prick/Intradermal Testing Total Pricks 13           Past Medical History: Patient Active Problem List   Diagnosis Date Noted  . 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 Drug reaction 05/30/2020  . Rash and other nonspecific skin eruption 05/30/2020  . Vaccine counseling 05/30/2020   Past Medical History:  Diagnosis Date  . Hypothyroid 2020  . Thyroid disease    Past Surgical History: History reviewed. No pertinent surgical history. Medication List:  Current Outpatient Medications  Medication Sig Dispense Refill  . cholecalciferol (VITAMIN D3) 25 MCG (1000 UNIT) tablet Take 1,000 Units by mouth daily.    . ferrous sulfate 325 (65 FE) MG EC tablet Take 325 mg by mouth 3 (three) times daily with meals.    Donna Lynch A PO Take by mouth daily.    . L-TYROSINE PO Take by mouth daily.    . Menaquinone-7 (VITAMIN K2 PO) Take by mouth daily.    . Omega-3 Fatty Acids (OMEGA-3 FISH OIL PO) Take by mouth daily.    . SELENIUM PO Take by mouth daily.    Marland Kitchen levothyroxine (SYNTHROID) 100 MCG tablet Take 100 mcg by mouth daily before breakfast. (Patient not taking: Reported on 05/30/2020)     No current facility-administered medications for this visit.   Allergies: Allergies  Allergen Reactions  . Other Shortness Of Breath    Avoids ghost peppers  . Penicillins    Social History: Social History   Socioeconomic History  . Marital status: Legally Separated    Spouse name: Not on file  . Number of children: Not on file  . Years of education: Not on file  . Highest education level: Not on file  Occupational History  . Not on file  Tobacco Use  . Smoking status: Former Research scientist (life sciences)  . Smokeless tobacco: Never Used  Vaping Use  . Vaping Use: Never used  Substance and Sexual Activity  . Alcohol use: Yes    Comment: socially  . Drug use: Never  . Sexual activity: Not on file  Other Topics Concern  . Not on file  Social History Narrative  . Not on  file   Social Determinants of Health   Financial Resource Strain:   . Difficulty of Paying Living Expenses: Not on file  Food Insecurity:   . Worried About  Running Out of Food in the Last Year: Not on file  . Ran Out of Food in the Last Year: Not on file  Transportation Needs:   . Lack of Transportation (Medical): Not on file  . Lack of Transportation (Non-Medical): Not on file  Physical Activity:   . Days of Exercise per Week: Not on file  . Minutes of Exercise per Session: Not on file  Stress:   . Feeling of Stress : Not on file  Social Connections:   . Frequency of Communication with Friends and Family: Not on file  . Frequency of Social Gatherings with Friends and Family: Not on file  . Attends Religious Services: Not on file  . Active Member of Clubs or Organizations: Not on file  . Attends Archivist Meetings: Not on file  . Marital Status: Not on file   Lives in a townhome. 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 Healthy Mother   . Healthy Father    Problem                               Relation Asthma                                   No  Eczema                                No  Food allergy                          No  Allergic rhino conjunctivitis     No   Review of Systems  Constitutional: Negative for appetite change, chills, fever and unexpected weight change.  HENT: Negative for congestion and rhinorrhea.   Eyes: Negative for itching.  Respiratory: Negative for cough, chest tightness, shortness of breath and wheezing.   Cardiovascular: Negative for chest pain.  Gastrointestinal: Negative for abdominal pain.  Genitourinary: Negative for difficulty urinating.  Skin: Positive for rash.   Neurological: Negative for headaches.   Objective: BP 110/78   Pulse 67   Temp (!) 97.1 F (36.2 C) (Temporal)   Resp 16   Ht 5' 2.28" (1.582 m)   Wt 204 lb 1.9 oz (92.6 kg)   SpO2 97%   BMI 37.00 kg/m  Body mass index is 37 kg/m. Physical Exam Vitals and nursing note reviewed.  Constitutional:      Appearance: Normal appearance. She is well-developed.  HENT:     Head: Normocephalic and atraumatic.     Right Ear: External ear normal.     Left Ear: External ear normal.     Nose: Nose normal.     Mouth/Throat:     Mouth: Mucous membranes are moist.     Pharynx: Oropharynx is clear.  Eyes:     Conjunctiva/sclera: Conjunctivae normal.  Cardiovascular:     Rate and Rhythm: Normal rate and regular rhythm.     Heart sounds: Normal heart sounds. No murmur heard.  No friction rub. No gallop.   Pulmonary:  Effort: Pulmonary effort is normal.     Breath sounds: Normal breath sounds. No wheezing, rhonchi or rales.  Musculoskeletal:     Cervical back: Neck supple.  Skin:    General: Skin is warm.     Findings: Rash present.     Comments: Scattered papular rash on left cheek > right cheek  Neurological:     Mental Status: She is alert and oriented to person, place, and time.  Psychiatric:        Behavior: Behavior normal.    The plan was reviewed with the patient/family, and all questions/concerned were addressed.  It was my pleasure to see Macil today and participate in her care. Please feel free to contact me with any questions or concerns.  Sincerely,  Rexene Alberts, DO Allergy & Immunology  [facility] office: [place] office: [phone]

## 2020-05-30 ENCOUNTER — Ambulatory Visit: Payer: No Typology Code available for payment source | Admitting: Allergy

## 2020-05-30 ENCOUNTER — Encounter: Payer: Self-pay | Admitting: Allergy

## 2020-05-30 ENCOUNTER — Other Ambulatory Visit: Payer: Self-pay

## 2020-05-30 VITALS — BP 110/78 | HR 67 | Temp 97.1°F | Resp 16 | Ht 62.28 in | Wt 204.1 lb

## 2020-05-30 DIAGNOSIS — R21 Rash and other nonspecific skin eruption: Secondary | ICD-10-CM | POA: Insufficient documentation

## 2020-05-30 DIAGNOSIS — T50B95D Adverse effect of other viral vaccines, subsequent encounter: Secondary | ICD-10-CM

## 2020-05-30 DIAGNOSIS — Z7185 Encounter for immunization safety counseling: Secondary | ICD-10-CM | POA: Diagnosis not present

## 2020-05-30 DIAGNOSIS — T50905A Adverse effect of unspecified drugs, medicaments and biological substances, initial encounter: Secondary | ICD-10-CM | POA: Insufficient documentation

## 2020-05-30 DIAGNOSIS — T50905D Adverse effect of unspecified drugs, medicaments and biological substances, subsequent encounter: Secondary | ICD-10-CM | POA: Diagnosis not present

## 2020-05-30 DIAGNOSIS — T50B95A Adverse effect of other viral vaccines, initial encounter: Secondary | ICD-10-CM | POA: Insufficient documentation

## 2020-05-30 NOTE — Assessment & Plan Note (Addendum)
Persistent facial rash which she says it's not bothersome at this time.   See below for proper skin care.   Recommend establish care with PCP.

## 2020-05-30 NOTE — Assessment & Plan Note (Signed)
Penicillin caused lip swelling as a child as per her grandmother.   Continue to avoid.  Consider penicillin testing in future. Over 90% outgrow penicillin allergy.

## 2020-05-30 NOTE — Patient Instructions (Addendum)
   Today's skin prick and intradermal testing was negative to Miralax (source of PEG 3350), methylprednisolone acetate (source of PEG 3350), and triamcinolone acetonide (source of polysorbate-80).  Recommend getting either the J&J vaccine or the Pfizer vaccine.  Take zyrtec 10mg  1-2 hour before the injection and wait 30 minutes.   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  Consider penicillin testing in future. Over 90% outgrow this allergy.  Skin:  See below for proper skin care.   Follow up as needed.   Recommend to establish care with PCP to check on your thyroid levels.  Skin care recommendations  Bath time: . Always use lukewarm water. AVOID very hot or cold water. Marland Kitchen Keep bathing time to 5-10 minutes. . Do NOT use bubble bath. . Use a mild soap and use just enough to wash the dirty areas. . Do NOT scrub skin vigorously.  . After bathing, pat dry your skin with a towel. Do NOT rub or scrub the skin.  Moisturizers and prescriptions:  . ALWAYS apply moisturizers immediately after bathing (within 3 minutes). This helps to lock-in moisture. . Use the moisturizer several times a day over the whole body. Kermit Balo summer moisturizers include: Aveeno, CeraVe, Cetaphil. Kermit Balo winter moisturizers include: Aquaphor, Vaseline, Cerave, Cetaphil, Eucerin, Vanicream. . When using moisturizers along with medications, the moisturizer should be applied about one hour after applying the medication to prevent diluting effect of the medication or moisturize around where you applied the medications. When not using medications, the moisturizer can be continued twice daily as maintenance.  Laundry and clothing: . Avoid laundry products with added color or perfumes. . Use unscented hypo-allergenic laundry products such as Tide free, Cheer free & gentle, and All free and clear.  . If the  skin still seems dry or sensitive, you can try double-rinsing the clothes. . Avoid tight or scratchy clothing such as wool. . Do not use fabric softeners or dyer sheets.

## 2020-05-30 NOTE — Assessment & Plan Note (Addendum)
Developed some tongue tingling, difficulty breathing after her first pfizer injection with elevated blood pressure. Symptoms resolved after benadryl. Had some associated localized arm swelling which resolved. However, now has persistent facial rash. She had this type of rash before after drinking wine or pineapple which usually resolve after a few hours. Concerned about receiving her second injection due these symptoms. History of thyroid issues but not on synthroid anymore and does not have current PCP.   Reviewed at length with patient the difference between adverse side effects versus allergic reactions to vaccines. Based on her clinical history she had some adverse side effects but not IgE-mediated allergic reaction. I doubt the facial rash is due to the vaccine.   She underwent Covid-19 component testing which was all negative.  Today's skin prick and intradermal testing was negative to Miralax (source of PEG 3350), methylprednisolone acetate (source of PEG 3350), and triamcinolone acetonide (source of polysorbate-80).  Offered patient either the J&J vaccine or the second Pfizer vaccine in our office today but she is hesitant and would like to think about this some more.  Discussed that the risk of blood clots with the J&J is extremely low and there is a higher chance of getting blood clots from COVID-19 infection in unvaccinated people.  Patient does not meet criteria for medical exemption based on above clinical history and testing results.    Provided patient with dates of our vaccine clinics - she may get at our office or at [facility].   Recommendation: Take zyrtec 10mg  1-2 hour before the injection and wait 30 minutes after the injection.

## 2020-06-13 ENCOUNTER — Ambulatory Visit: Payer: Self-pay | Admitting: Allergy

## 2020-06-28 ENCOUNTER — Encounter: Payer: Self-pay | Admitting: Family Medicine

## 2020-06-28 ENCOUNTER — Other Ambulatory Visit: Payer: Self-pay

## 2020-06-28 ENCOUNTER — Ambulatory Visit (INDEPENDENT_AMBULATORY_CARE_PROVIDER_SITE_OTHER): Payer: No Typology Code available for payment source | Admitting: Family Medicine

## 2020-06-28 VITALS — BP 124/85 | HR 75 | Temp 98.1°F | Resp 20 | Ht 62.28 in | Wt 202.2 lb

## 2020-06-28 DIAGNOSIS — E039 Hypothyroidism, unspecified: Secondary | ICD-10-CM

## 2020-06-28 DIAGNOSIS — Z1159 Encounter for screening for other viral diseases: Secondary | ICD-10-CM

## 2020-06-28 DIAGNOSIS — Z0000 Encounter for general adult medical examination without abnormal findings: Secondary | ICD-10-CM

## 2020-06-28 DIAGNOSIS — K921 Melena: Secondary | ICD-10-CM | POA: Diagnosis not present

## 2020-06-28 DIAGNOSIS — Z23 Encounter for immunization: Secondary | ICD-10-CM | POA: Diagnosis not present

## 2020-06-28 DIAGNOSIS — Z114 Encounter for screening for human immunodeficiency virus [HIV]: Secondary | ICD-10-CM

## 2020-06-28 DIAGNOSIS — Z6836 Body mass index (BMI) 36.0-36.9, adult: Secondary | ICD-10-CM | POA: Diagnosis not present

## 2020-06-28 DIAGNOSIS — Z862 Personal history of diseases of the blood and blood-forming organs and certain disorders involving the immune mechanism: Secondary | ICD-10-CM

## 2020-06-28 NOTE — Patient Instructions (Signed)
DASH Eating Plan DASH stands for "Dietary Approaches to Stop Hypertension." The DASH eating plan is a healthy eating plan that has been shown to reduce high blood pressure (hypertension). It may also reduce your risk for type 2 diabetes, heart disease, and stroke. The DASH eating plan may also help with weight loss. What are tips for following this plan?  General guidelines  Avoid eating more than 2,300 mg (milligrams) of salt (sodium) a day. If you have hypertension, you may need to reduce your sodium intake to 1,500 mg a day.  Limit alcohol intake to no more than 1 drink a day for nonpregnant women and 2 drinks a day for men. One drink equals 12 oz of beer, 5 oz of wine, or 1 oz of hard liquor.  Work with your health care provider to maintain a healthy body weight or to lose weight. Ask what an ideal weight is for you.  Get at least 30 minutes of exercise that causes your heart to beat faster (aerobic exercise) most days of the week. Activities may include walking, swimming, or biking.  Work with your health care provider or diet and nutrition specialist (dietitian) to adjust your eating plan to your individual calorie needs. Reading food labels   Check food labels for the amount of sodium per serving. Choose foods with less than 5 percent of the Daily Value of sodium. Generally, foods with less than 300 mg of sodium per serving fit into this eating plan.  To find whole grains, look for the word "whole" as the first word in the ingredient list. Shopping  Buy products labeled as "low-sodium" or "no salt added."  Buy fresh foods. Avoid canned foods and premade or frozen meals. Cooking  Avoid adding salt when cooking. Use salt-free seasonings or herbs instead of table salt or sea salt. Check with your health care provider or pharmacist before using salt substitutes.  Do not fry foods. Cook foods using healthy methods such as baking, boiling, grilling, and broiling instead.  Cook with  heart-healthy oils, such as olive, canola, soybean, or sunflower oil. Meal planning  Eat a balanced diet that includes: ? 5 or more servings of fruits and vegetables each day. At each meal, try to fill half of your plate with fruits and vegetables. ? Up to 6-8 servings of whole grains each day. ? Less than 6 oz of lean meat, poultry, or fish each day. A 3-oz serving of meat is about the same size as a deck of cards. One egg equals 1 oz. ? 2 servings of low-fat dairy each day. ? A serving of nuts, seeds, or beans 5 times each week. ? Heart-healthy fats. Healthy fats called Omega-3 fatty acids are found in foods such as flaxseeds and coldwater fish, like sardines, salmon, and mackerel.  Limit how much you eat of the following: ? Canned or prepackaged foods. ? Food that is high in trans fat, such as fried foods. ? Food that is high in saturated fat, such as fatty meat. ? Sweets, desserts, sugary drinks, and other foods with added sugar. ? Full-fat dairy products.  Do not salt foods before eating.  Try to eat at least 2 vegetarian meals each week.  Eat more home-cooked food and less restaurant, buffet, and fast food.  When eating at a restaurant, ask that your food be prepared with less salt or no salt, if possible. What foods are recommended? The items listed may not be a complete list. Talk with your dietitian about   what dietary choices are best for you. Grains Whole-grain or whole-wheat bread. Whole-grain or whole-wheat pasta. Brown rice. Oatmeal. Quinoa. Bulgur. Whole-grain and low-sodium cereals. Pita bread. Low-fat, low-sodium crackers. Whole-wheat flour tortillas. Vegetables Fresh or frozen vegetables (raw, steamed, roasted, or grilled). Low-sodium or reduced-sodium tomato and vegetable juice. Low-sodium or reduced-sodium tomato sauce and tomato paste. Low-sodium or reduced-sodium canned vegetables. Fruits All fresh, dried, or frozen fruit. Canned fruit in natural juice (without  added sugar). Meat and other protein foods Skinless chicken or turkey. Ground chicken or turkey. Pork with fat trimmed off. Fish and seafood. Egg whites. Dried beans, peas, or lentils. Unsalted nuts, nut butters, and seeds. Unsalted canned beans. Lean cuts of beef with fat trimmed off. Low-sodium, lean deli meat. Dairy Low-fat (1%) or fat-free (skim) milk. Fat-free, low-fat, or reduced-fat cheeses. Nonfat, low-sodium ricotta or cottage cheese. Low-fat or nonfat yogurt. Low-fat, low-sodium cheese. Fats and oils Soft margarine without trans fats. Vegetable oil. Low-fat, reduced-fat, or light mayonnaise and salad dressings (reduced-sodium). Canola, safflower, olive, soybean, and sunflower oils. Avocado. Seasoning and other foods Herbs. Spices. Seasoning mixes without salt. Unsalted popcorn and pretzels. Fat-free sweets. What foods are not recommended? The items listed may not be a complete list. Talk with your dietitian about what dietary choices are best for you. Grains Baked goods made with fat, such as croissants, muffins, or some breads. Dry pasta or rice meal packs. Vegetables Creamed or fried vegetables. Vegetables in a cheese sauce. Regular canned vegetables (not low-sodium or reduced-sodium). Regular canned tomato sauce and paste (not low-sodium or reduced-sodium). Regular tomato and vegetable juice (not low-sodium or reduced-sodium). Pickles. Olives. Fruits Canned fruit in a light or heavy syrup. Fried fruit. Fruit in cream or butter sauce. Meat and other protein foods Fatty cuts of meat. Ribs. Fried meat. Bacon. Sausage. Bologna and other processed lunch meats. Salami. Fatback. Hotdogs. Bratwurst. Salted nuts and seeds. Canned beans with added salt. Canned or smoked fish. Whole eggs or egg yolks. Chicken or turkey with skin. Dairy Whole or 2% milk, cream, and half-and-half. Whole or full-fat cream cheese. Whole-fat or sweetened yogurt. Full-fat cheese. Nondairy creamers. Whipped toppings.  Processed cheese and cheese spreads. Fats and oils Butter. Stick margarine. Lard. Shortening. Ghee. Bacon fat. Tropical oils, such as coconut, palm kernel, or palm oil. Seasoning and other foods Salted popcorn and pretzels. Onion salt, garlic salt, seasoned salt, table salt, and sea salt. Worcestershire sauce. Tartar sauce. Barbecue sauce. Teriyaki sauce. Soy sauce, including reduced-sodium. Steak sauce. Canned and packaged gravies. Fish sauce. Oyster sauce. Cocktail sauce. Horseradish that you find on the shelf. Ketchup. Mustard. Meat flavorings and tenderizers. Bouillon cubes. Hot sauce and Tabasco sauce. Premade or packaged marinades. Premade or packaged taco seasonings. Relishes. Regular salad dressings. Where to find more information:  National Heart, Lung, and Blood Institute: www.nhlbi.nih.gov  American Heart Association: www.heart.org Summary  The DASH eating plan is a healthy eating plan that has been shown to reduce high blood pressure (hypertension). It may also reduce your risk for type 2 diabetes, heart disease, and stroke.  With the DASH eating plan, you should limit salt (sodium) intake to 2,300 mg a day. If you have hypertension, you may need to reduce your sodium intake to 1,500 mg a day.  When on the DASH eating plan, aim to eat more fresh fruits and vegetables, whole grains, lean proteins, low-fat dairy, and heart-healthy fats.  Work with your health care provider or diet and nutrition specialist (dietitian) to adjust your eating plan to your   individual calorie needs. This information is not intended to replace advice given to you by your health care provider. Make sure you discuss any questions you have with your health care provider. Document Revised: 07/16/2017 Document Reviewed: 07/27/2016 Elsevier Patient Education  [place]. Hypothyroidism  Hypothyroidism is when the thyroid gland does not make enough of certain hormones (it is underactive). The thyroid  gland is a small gland located in the lower front part of the neck, just in front of the windpipe (trachea). This gland makes hormones that help control how the body uses food for energy (metabolism) as well as how the heart and brain function. These hormones also play a role in keeping your bones strong. When the thyroid is underactive, it produces too little of the hormones thyroxine (T4) and triiodothyronine (T3). What are the causes? This condition may be caused by:  Hashimoto's disease. This is a disease in which the body's disease-fighting system (immune system) attacks the thyroid gland. This is the most common cause.  Viral infections.  Pregnancy.  Certain medicines.  Birth defects.  Past radiation treatments to the head or neck for cancer.  Past treatment with radioactive iodine.  Past exposure to radiation in the environment.  Past surgical removal of part or all of the thyroid.  Problems with a gland in the center of the brain (pituitary gland).  Lack of enough iodine in the diet. What increases the risk? You are more likely to develop this condition if:  You are female.  You have a family history of thyroid conditions.  You use a medicine called lithium.  You take medicines that affect the immune system (immunosuppressants). What are the signs or symptoms? Symptoms of this condition include:  Feeling as though you have no energy (lethargy).  Not being able to tolerate cold.  Weight gain that is not explained by a change in diet or exercise habits.  Lack of appetite.  Dry skin.  Coarse hair.  Menstrual irregularity.  Slowing of thought processes.  Constipation.  Sadness or depression. How is this diagnosed? This condition may be diagnosed based on:  Your symptoms, your medical history, and a physical exam.  Blood tests. You may also have imaging tests, such as an ultrasound or MRI. How is this treated? This condition is treated with medicine  that replaces the thyroid hormones that your body does not make. After you begin treatment, it may take several weeks for symptoms to go away. Follow these instructions at home:  Take over-the-counter and prescription medicines only as told by your health care provider.  If you start taking any new medicines, tell your health care provider.  Keep all follow-up visits as told by your health care provider. This is important. ? As your condition improves, your dosage of thyroid hormone medicine may change. ? You will need to have blood tests regularly so that your health care provider can monitor your condition. Contact a health care provider if:  Your symptoms do not get better with treatment.  You are taking thyroid replacement medicine and you: ? Sweat a lot. ? Have tremors. ? Feel anxious. ? Lose weight rapidly. ? Cannot tolerate heat. ? Have emotional swings. ? Have diarrhea. ? Feel weak. Get help right away if you have:  Chest pain.  An irregular heartbeat.  A rapid heartbeat.  Difficulty breathing. Summary  Hypothyroidism is when the thyroid gland does not make enough of certain hormones (it is underactive).  When the thyroid is underactive, it  produces too little of the hormones thyroxine (T4) and triiodothyronine (T3).  The most common cause is Hashimoto's disease, a disease in which the body's disease-fighting system (immune system) attacks the thyroid gland. The condition can also be caused by viral infections, medicine, pregnancy, or past radiation treatment to the head or neck.  Symptoms may include weight gain, dry skin, constipation, feeling as though you do not have energy, and not being able to tolerate cold.  This condition is treated with medicine to replace the thyroid hormones that your body does not make. This information is not intended to replace advice given to you by your health care provider. Make sure you discuss any questions you have with your  health care provider. Document Revised: 07/16/2017 Document Reviewed: 07/14/2017 Elsevier Patient Education  2020 [employer].

## 2020-06-28 NOTE — Progress Notes (Signed)
New Patient Office Visit  Subjective:  Patient ID: Donna Lynch, female    DOB: 28-Feb-1976  Age: 44 y.o. MRN: [id number]  CC:  Chief Complaint  Patient presents with  . Establish Care    HPI Donna Lynch presents to establish care.   1. Hypothyroidism She has not taken synthroid for few months. She stopped taking her synthroid because she felt like everything she took it she would fluid on her left knee and swelling in her feet. She would like a compete panel for her tyroid and would like her reserve T3 tested. She reports fatigue, hair loss, and weight gain. She reports that she doesn't feel like her thyroid has even been well treated. Her goal is to not be on medication if possible. She is interested in an endocrinology referral.   2. Blood in stool Donna Lynch reports a little blood at times with wiping after a BM. She also reports blood that seems built inside for stool for the last year. She reports her stool is reddish orange. This has been going on for a year. She does feel a twinge and cramping at times in her stomach area after eating. Denies diarrhea, constipation, vomiting, or heartburn. She has been a little nausea the last few weeks. She has had her gallbladder removed. She does report some rectal itching. She has never had a colonoscopy.     Past Medical History:  Diagnosis Date  . Hypothyroid 2020  . Thyroid disease     Past Surgical History:  Procedure Laterality Date  . CHOLECYSTECTOMY    . NECK SURGERY      Family History  Problem Relation Age of Onset  . Healthy Mother   . Healthy Father   . Thyroid disease Maternal Grandmother   . Cancer Paternal Grandmother        abdominal    Social History   Socioeconomic History  . Marital status: Legally Separated    Spouse name: Not on file  . Number of children: Not on file  . Years of education: Not on file  . Highest education level: Not on file  Occupational History  . Not on file  Tobacco Use  .  Smoking status: Former Smoker    Types: Cigarettes    Quit date: 08/17/2010    Years since quitting: 9.8  . Smokeless tobacco: Never Used  Vaping Use  . Vaping Use: Never used  Substance and Sexual Activity  . Alcohol use: Yes    Comment: socially  . Drug use: Never  . Sexual activity: Not on file  Other Topics Concern  . Not on file  Social History Narrative  . Not on file   Social Determinants of Health   Financial Resource Strain:   . Difficulty of Paying Living Expenses: Not on file  Food Insecurity:   . Worried About Charity fundraiser in the Last Year: Not on file  . Ran Out of Food in the Last Year: Not on file  Transportation Needs:   . Lack of Transportation (Medical): Not on file  . Lack of Transportation (Non-Medical): Not on file  Physical Activity:   . Days of Exercise per Week: Not on file  . Minutes of Exercise per Session: Not on file  Stress:   . Feeling of Stress : Not on file  Social Connections:   . Frequency of Communication with Friends and Family: Not on file  . Frequency of Social Gatherings with Friends and Family: Not  on file  . Attends Religious Services: Not on file  . Active Member of Clubs or Organizations: Not on file  . Attends Archivist Meetings: Not on file  . Marital Status: Not on file  Intimate Partner Violence:   . Fear of Current or Ex-Partner: Not on file  . Emotionally Abused: Not on file  . Physically Abused: Not on file  . Sexually Abused: Not on file    ROS Review of Systems  Per HPI.  Objective:   Today's Vitals: BP 124/85   Pulse 75   Temp 98.1 F (36.7 C) (Temporal)   Resp 20   Ht 5' 2.28" (1.582 m)   Wt 202 lb 4 oz (91.7 kg)   SpO2 99%   BMI 36.66 kg/m   Physical Exam Vitals and nursing note reviewed.  Constitutional:      General: She is not in acute distress.    Appearance: Normal appearance. She is not ill-appearing, toxic-appearing or diaphoretic.  Neck:     Thyroid: No thyroid mass,  thyromegaly or thyroid tenderness.     Vascular: No carotid bruit.  Cardiovascular:     Rate and Rhythm: Normal rate and regular rhythm.     Heart sounds: Normal heart sounds. No murmur heard.   Pulmonary:     Effort: Pulmonary effort is normal. No respiratory distress.     Breath sounds: Normal breath sounds.  Abdominal:     General: Bowel sounds are normal. There is no distension.     Palpations: Abdomen is soft. There is no mass.     Tenderness: There is no abdominal tenderness. There is no guarding or rebound.     Hernia: No hernia is present.  Musculoskeletal:     Cervical back: Neck supple.     Left lower leg: No edema.  Neurological:     General: No focal deficit present.     Mental Status: She is alert and oriented to person, place, and time.  Psychiatric:        Mood and Affect: Mood normal.        Behavior: Behavior normal.     Assessment & Plan:   Donna Lynch was seen today for establish care.  Diagnoses and all orders for this visit:  Acquired hypothyroidism Labs pending. Referral to endocrinology placed. Reiterated to patient that medication will likely be needed and that her symptom are likely due to thyroid dysfunction.  -     Thyroid Panel With TSH -     T3, reverse -     Ambulatory referral to Endocrinology  Blood in stool, frank Sent home with hemoccult card. Discussed preparation H for rectal itching for possible hemorrhoids. Referral to GI.  -     POC Hemoccult Bld/Stl (3-Cd Home Screen); Future -     Ambulatory referral to Gastroenterology  BMI 36.0-36.9,adult Phentermine therapy managed by bariatric clinic. Labs pending.  -     Thyroid Panel With TSH -     CMP14+EGFR -     Lipid panel -     CBC with Differential/Platelet  History of anemia Takes iron supplement. Labs pending. -     CBC with Differential/Platelet  Encounter for screening for HIV -     HIV Antibody (routine testing w rflx)  Need for hepatitis C screening test -     Hepatitis C  antibody  Need for Tdap vaccination -     Tdap vaccine greater than or equal to 7yo IM  Need for  immunization against influenza Flu vaccine today in office.  -     Flu Vaccine QUAD 36+ mos IM  Encounter for medical examination to establish care   Follow up in 1 month for CPE with PAP.   The patient indicates understanding of these issues and agrees with the plan.   Gwenlyn Perking, FNP

## 2020-06-29 LAB — LIPID PANEL
LDL Chol Calc (NIH): 127 mg/dL — ABNORMAL HIGH (ref 0–99)
VLDL Cholesterol Cal: 30 mg/dL (ref 5–40)

## 2020-06-29 LAB — CMP14+EGFR
AST: 17 IU/L (ref 0–40)
BUN: 9 mg/dL (ref 6–24)
Bilirubin Total: 0.4 mg/dL (ref 0.0–1.2)
GFR calc Af Amer: 122 mL/min/{1.73_m2} (ref >59)
GFR calc non Af Amer: 106 mL/min/{1.73_m2} (ref >59)
Globulin, Total: 2.3 g/dL (ref 1.5–4.5)
Glucose: 93 mg/dL (ref 65–99)
Sodium: 143 mmol/L (ref 134–144)
Total Protein: 6.6 g/dL (ref 6.0–8.5)

## 2020-06-29 LAB — CBC WITH DIFFERENTIAL/PLATELET
Basophils Absolute: 0.1 10*3/uL (ref 0.0–0.2)
EOS (ABSOLUTE): 0.1 10*3/uL (ref 0.0–0.4)
Immature Granulocytes: 0 %
Lymphocytes Absolute: 1.5 10*3/uL (ref 0.7–3.1)
MCV: 83 fL (ref 79–97)
Monocytes Absolute: 0.3 10*3/uL (ref 0.1–0.9)
Neutrophils: 45 %
RDW: 16.3 % — ABNORMAL HIGH (ref 11.7–15.4)
WBC: 3.5 10*3/uL (ref 3.4–10.8)

## 2020-06-29 LAB — HEPATITIS C ANTIBODY: Hep C Virus Ab: <0.1 s/co ratio (ref 0.0–0.9)

## 2020-06-29 LAB — T3, REVERSE

## 2020-07-01 LAB — CMP14+EGFR
ALT: 10 IU/L (ref 0–32)
Albumin/Globulin Ratio: 1.9 (ref 1.2–2.2)
Albumin: 4.3 g/dL (ref 3.8–4.8)
Alkaline Phosphatase: 89 IU/L (ref 44–121)
BUN/Creatinine Ratio: 13 (ref 9–23)
CO2: 24 mmol/L (ref 20–29)
Calcium: 9 mg/dL (ref 8.7–10.2)
Chloride: 104 mmol/L (ref 96–106)
Creatinine, Ser: 0.67 mg/dL (ref 0.57–1.00)
Potassium: 4.5 mmol/L (ref 3.5–5.2)

## 2020-07-01 LAB — THYROID PANEL WITH TSH
Free Thyroxine Index: 1.4 (ref 1.2–4.9)
T3 Uptake Ratio: 26 % (ref 24–39)
T4, Total: 5.3 ug/dL (ref 4.5–12.0)
TSH: 10.8 u[IU]/mL — ABNORMAL HIGH (ref 0.450–4.500)

## 2020-07-01 LAB — CBC WITH DIFFERENTIAL/PLATELET
Basos: 1 %
Eos: 3 %
Hematocrit: 37.8 % (ref 34.0–46.6)
Hemoglobin: 11.9 g/dL (ref 11.1–15.9)
Immature Grans (Abs): 0 10*3/uL (ref 0.0–0.1)
Lymphs: 42 %
MCH: 26.2 pg — ABNORMAL LOW (ref 26.6–33.0)
MCHC: 31.5 g/dL (ref 31.5–35.7)
Monocytes: 9 %
Neutrophils Absolute: 1.6 10*3/uL (ref 1.4–7.0)
Platelets: 305 10*3/uL (ref 150–450)
RBC: 4.55 x10E6/uL (ref 3.77–5.28)

## 2020-07-01 LAB — LIPID PANEL
Chol/HDL Ratio: 4 ratio (ref 0.0–4.4)
Cholesterol, Total: 209 mg/dL — ABNORMAL HIGH (ref 100–199)
HDL: 52 mg/dL (ref >39)
Triglycerides: 170 mg/dL — ABNORMAL HIGH (ref 0–149)

## 2020-07-01 LAB — HIV ANTIBODY (ROUTINE TESTING W REFLEX): HIV Screen 4th Generation wRfx: NONREACTIVE

## 2020-07-02 ENCOUNTER — Encounter: Payer: Self-pay | Admitting: Internal Medicine

## 2020-07-02 ENCOUNTER — Other Ambulatory Visit: Payer: Self-pay

## 2020-07-02 ENCOUNTER — Encounter: Payer: Self-pay | Admitting: "Endocrinology

## 2020-07-02 ENCOUNTER — Ambulatory Visit (INDEPENDENT_AMBULATORY_CARE_PROVIDER_SITE_OTHER): Payer: No Typology Code available for payment source | Admitting: "Endocrinology

## 2020-07-02 VITALS — BP 114/66 | HR 72 | Ht 62.0 in | Wt 205.4 lb

## 2020-07-02 DIAGNOSIS — E039 Hypothyroidism, unspecified: Secondary | ICD-10-CM | POA: Diagnosis not present

## 2020-07-02 NOTE — Progress Notes (Signed)
Endocrinology Consult Note                                            07/02/2020, 1:01 PM   Subjective:    Patient ID: Donna Lynch, female    DOB: 07/25/1976, PCP Gwenlyn Perking, FNP   Past Medical History:  Diagnosis Date  . Anemia   . Hypothyroid 2020  . Thyroid disease    Past Surgical History:  Procedure Laterality Date  . CHOLECYSTECTOMY    . NECK SURGERY     Social History   Socioeconomic History  . Marital status: Legally Separated    Spouse name: Not on file  . Number of children: Not on file  . Years of education: Not on file  . Highest education level: Not on file  Occupational History  . Not on file  Tobacco Use  . Smoking status: Former Smoker    Types: Cigarettes    Quit date: 08/17/2010    Years since quitting: 9.8  . Smokeless tobacco: Never Used  Vaping Use  . Vaping Use: Never used  Substance and Sexual Activity  . Alcohol use: Yes    Comment: socially  . Drug use: Never  . Sexual activity: Not on file  Other Topics Concern  . Not on file  Social History Narrative  . Not on file   Social Determinants of Health   Financial Resource Strain:   . Difficulty of Paying Living Expenses: Not on file  Food Insecurity:   . Worried About Charity fundraiser in the Last Year: Not on file  . Ran Out of Food in the Last Year: Not on file  Transportation Needs:   . Lack of Transportation (Medical): Not on file  . Lack of Transportation (Non-Medical): Not on file  Physical Activity:   . Days of Exercise per Week: Not on file  . Minutes of Exercise per Session: Not on file  Stress:   . Feeling of Stress : Not on file  Social Connections:   . Frequency of Communication with Friends and Family: Not on file  . Frequency of Social Gatherings with Friends and Family: Not on file  . Attends Religious Services: Not on file  . Active Member of Clubs or Organizations: Not on file  . Attends Archivist Meetings: Not on file  .  Marital Status: Not on file   Family History  Problem Relation Age of Onset  . Healthy Mother   . Healthy Father   . Thyroid disease Maternal Grandmother   . Cancer Paternal Grandmother        abdominal   Outpatient Encounter Medications as of 07/02/2020  Medication Sig  . Calcium-Magnesium 100-50 MG TABS Take by mouth.  . cholecalciferol (VITAMIN D3) 25 MCG (1000 UNIT) tablet Take 1,000 Units by mouth daily.  . ferrous sulfate 325 (65 FE) MG EC tablet Take 325 mg by mouth 3 (three) times daily with meals.  Nani Ravens A PO Take by mouth daily.  . L-TYROSINE PO Take by mouth daily.  . Menaquinone-7 (VITAMIN K2 PO) Take by mouth daily.  . Omega-3 Fatty Acids (OMEGA-3 FISH OIL PO) Take by mouth daily.  . phentermine 37.5 MG capsule Take 37.5 mg by mouth every morning. RX by bariatric clinic  . SELENIUM PO Take by mouth daily.  . [  DISCONTINUED] levothyroxine (SYNTHROID) 100 MCG tablet Take 100 mcg by mouth daily before breakfast. (Patient not taking: Reported on 05/30/2020)   No facility-administered encounter medications on file as of 07/02/2020.   ALLERGIES: Allergies  Allergen Reactions  . Other Shortness Of Breath    Avoids ghost peppers  . Penicillins     VACCINATION STATUS: Immunization History  Administered Date(s) Administered  . Influenza Inj Mdck Quad Pf 11/18/2018  . Influenza,inj,Quad PF,6+ Mos 06/28/2020  . PFIZER SARS-COV-2 Vaccination 04/26/2020, 06/14/2020  . Tdap 06/28/2020    HPI Donna Lynch is 44 y.o. female who presents today with a medical history as above. she is being seen in consultation for hypothyroidism requested by Gwenlyn Perking, FNP.  History is obtained directly from the patient and chart review. She reports that she was diagnosed with hypothyroidism approximately 2 years ago, was given treatment with levothyroxine at 200 mcg. She did not tolerate or did not derive clinical benefit from this medication which she has stopped using.  She takes a couple of "thyroid supplements" from the vitamin shop. -Her major concern is progressive weight gain. Historically, she did have hair loss, dry skin on hands and feet. -Her most recent thyroid function tests are consistent with hypothyroidism/under treatment. -She denies any prior history of thyroid surgery nor thyroid ablation. She denies dysphagia, shortness of breath, nor voice change. She did not have any recent thyroid ultrasound.  Review of Systems  Constitutional: + Progressive weight gain, +fatigue, no subjective hyperthermia, no subjective hypothermia Eyes: no blurry vision, no xerophthalmia ENT: no sore throat, no nodules palpated in throat, no dysphagia/odynophagia, no hoarseness Cardiovascular: no Chest Pain, no Shortness of Breath, no palpitations, no leg swelling Respiratory: no cough, no shortness of breath Gastrointestinal: no Nausea/Vomiting/Diarhhea Musculoskeletal: no muscle/joint aches Skin: no rashes Neurological: no tremors, no numbness, no tingling, no dizziness Psychiatric: no depression, no anxiety  Objective:    Vitals with BMI 07/02/2020 06/28/2020 05/30/2020  Height 5\' 2"  5' 2.28" -  Weight 205 lbs 6 oz 202 lbs 4 oz -  BMI 14.78 29.56 -  Systolic 213 086 578  Diastolic 66 85 78  Pulse 72 75 67    BP 114/66   Pulse 72   Ht 5\' 2"  (1.575 m)   Wt 205 lb 6.4 oz (93.2 kg)   BMI 37.57 kg/m   Wt Readings from Last 3 Encounters:  07/02/20 205 lb 6.4 oz (93.2 kg)  06/28/20 202 lb 4 oz (91.7 kg)  05/30/20 204 lb 1.9 oz (92.6 kg)    Physical Exam  Constitutional:  Body mass index is 37.57 kg/m.,  not in acute distress, normal state of mind Eyes: PERRLA, EOMI, no exophthalmos ENT: moist mucous membranes, + palpable thyroid , no gross cervical lymphadenopathy Cardiovascular: normal precordial activity, Regular Rate and Rhythm, no Murmur/Rubs/Gallops Respiratory:  adequate breathing efforts, no gross chest deformity, Clear to auscultation  bilaterally Gastrointestinal: abdomen soft, Non -tender, No distension, Bowel Sounds present, no gross organomegaly Musculoskeletal: no gross deformities, strength intact in all four extremities Skin: moist, warm, no rashes Neurological: no tremor with outstretched hands, Deep tendon reflexes normal in bilateral lower extremities.  CMP ( most recent) CMP     Component Value Date/Time   NA 143 06/28/2020 1003   K 4.5 06/28/2020 1003   CL 104 06/28/2020 1003   CO2 24 06/28/2020 1003   GLUCOSE 93 06/28/2020 1003   GLUCOSE 103 (H) 06/12/2009 0942   BUN 9 06/28/2020 1003   CREATININE 0.67  06/28/2020 1003   CALCIUM 9.0 06/28/2020 1003   PROT 6.6 06/28/2020 1003   ALBUMIN 4.3 06/28/2020 1003   AST 17 06/28/2020 1003   ALT 10 06/28/2020 1003   ALKPHOS 89 06/28/2020 1003   BILITOT 0.4 06/28/2020 1003   GFRNONAA 106 06/28/2020 1003   GFRAA 122 06/28/2020 1003   Lipid Panel     Component Value Date/Time   CHOL 209 (H) 06/28/2020 1003   TRIG 170 (H) 06/28/2020 1003   HDL 52 06/28/2020 1003   CHOLHDL 4.0 06/28/2020 1003   LDLCALC 127 (H) 06/28/2020 1003   LABVLDL 30 06/28/2020 1003      Lab Results  Component Value Date   TSH 10.800 (H) 06/28/2020       Assessment & Plan:   1. Hypothyroidism, unspecified type  - Donna Lynch  is being seen at a kind request of Gwenlyn Perking, FNP. - I have reviewed her available thyroid records and clinically evaluated the patient. - Based on these reviews, she has had clinical diagnosis of hypothyroidism, however she did not tolerate or generate clinical response from levothyroxine which she has stopped. She is using some thyroid supplements from over-the-counter sources. She is advised to discontinue.  She will be sent to lab for complete set of thyroid function tests, and thyroid sonogram for better characterization of her thyroid function and anatomy. Her labs will involve: - TSH - T4, free - T3, free - Thyroid peroxidase  antibody - Thyroglobulin antibody - VITAMIN D 25 Hydroxy (Vit-D Deficiency, Fractures)  - US THYROID-[facility] radiology .  -If her new labs confirm primary hypothyroidism, she would be considered for Synthroid instead of levothyroxine.  - I did not initiate any new prescriptions today. - she is advised to maintain close follow up with Gwenlyn Perking, FNP for primary care needs.   - Time spent with the patient: 45 minutes, of which >50% was spent in  counseling her about her hypothyroidism and the rest in obtaining information about her symptoms, reviewing her previous labs/studies ( including abstractions from other facilities),  evaluations, and treatments,  and developing a plan to confirm diagnosis and long term treatment based on the latest standards of care/guidelines; and documenting her care.  Donna Lynch participated in the discussions, expressed understanding, and voiced agreement with the above plans.  All questions were answered to her satisfaction. she is encouraged to contact clinic should she have any questions or concerns prior to her return visit.  Follow up plan: Return in about 10 days (around 07/12/2020) for Labs Today- Non-Fasting Ok, Thyroid / Neck Ultrasound.   Glade Lloyd, MD [facility] [address] [place], [place] [address] Phone: [phone]  Fax: [phone]     07/02/2020, 1:01 PM  This note was partially dictated with voice recognition software. Similar sounding words can be transcribed inadequately or may not  be corrected upon review.

## 2020-07-03 LAB — T4, FREE: Free T4: 0.8 ng/dL — ABNORMAL LOW (ref 0.82–1.77)

## 2020-07-03 LAB — T3, FREE: T3, Free: 3.7 pg/mL (ref 2.0–4.4)

## 2020-07-03 LAB — VITAMIN D 25 HYDROXY (VIT D DEFICIENCY, FRACTURES): Vit D, 25-Hydroxy: 24.9 ng/mL — ABNORMAL LOW (ref 30.0–100.0)

## 2020-07-03 LAB — TSH: TSH: 18.8 u[IU]/mL — ABNORMAL HIGH (ref 0.450–4.500)

## 2020-07-03 LAB — THYROGLOBULIN ANTIBODY: Thyroglobulin Antibody: <1 IU/mL (ref 0.0–0.9)

## 2020-07-03 LAB — THYROID PEROXIDASE ANTIBODY: Thyroperoxidase Ab SerPl-aCnc: 278 IU/mL — ABNORMAL HIGH (ref 0–34)

## 2020-07-09 ENCOUNTER — Ambulatory Visit (HOSPITAL_COMMUNITY): Payer: No Typology Code available for payment source

## 2020-07-15 ENCOUNTER — Ambulatory Visit: Payer: No Typology Code available for payment source | Admitting: "Endocrinology

## 2020-07-18 ENCOUNTER — Telehealth: Payer: Self-pay

## 2020-07-18 NOTE — Telephone Encounter (Signed)
Pt had an appointment for a thyroid ultrasound on 11/23 but did not show. I called pt this morning and gave her the telephone number for [facility] and central scheduling so she could reschedule for a date and time that was convenient for her.

## 2020-07-18 NOTE — Telephone Encounter (Signed)
Do you have this pt's ultrasound?

## 2020-07-22 ENCOUNTER — Telehealth: Payer: Self-pay

## 2020-07-23 ENCOUNTER — Other Ambulatory Visit: Payer: Self-pay

## 2020-07-23 ENCOUNTER — Telehealth: Payer: No Typology Code available for payment source | Admitting: "Endocrinology

## 2020-07-23 ENCOUNTER — Ambulatory Visit: Payer: No Typology Code available for payment source | Admitting: "Endocrinology

## 2020-07-23 NOTE — Telephone Encounter (Signed)
Dr. Dorris Fetch attempted to call the patient for the phone visit. Pt did not answer. Canceled appt check in.

## 2020-07-23 NOTE — Telephone Encounter (Signed)
Attempted to call patient back, went to voice mail, unable to leave message as pt has a voice mail box that is full

## 2020-07-23 NOTE — Telephone Encounter (Signed)
We have significant finding on her blood work I want to discuss with her. We can do ultrasound for a later visit. See if she can do a phone visit as soon as possible.

## 2020-07-23 NOTE — Telephone Encounter (Signed)
Pt was sch for appt with Dr Dorris Fetch today but was canceled because she has not done her [place], offered to give pt the number to resch her [place] and she said she was not calling them to resch that they can call her and resch. Pt also said she does not understand what Dr Dorris Fetch can not see in the blood work but can see on the [place].

## 2020-07-23 NOTE — Telephone Encounter (Signed)
Patient was called and advised to do phone visit to review labs, would be available in 15 minutes for return call, scheduled for phone visit

## 2020-07-23 NOTE — Telephone Encounter (Signed)
Patient has already had on scheduled by [place] but was a no show and was advised previously to call and schedule it according to her schedule.

## 2020-07-29 ENCOUNTER — Other Ambulatory Visit: Payer: Self-pay

## 2020-07-29 ENCOUNTER — Other Ambulatory Visit (HOSPITAL_COMMUNITY)
Admission: RE | Admit: 2020-07-29 | Discharge: 2020-07-29 | Disposition: A | Discharge status: Home / Self Care | Payer: No Typology Code available for payment source | Source: Ambulatory Visit | Attending: Family Medicine | Admitting: Family Medicine

## 2020-07-29 ENCOUNTER — Encounter: Payer: Self-pay | Admitting: Family Medicine

## 2020-07-29 ENCOUNTER — Ambulatory Visit (INDEPENDENT_AMBULATORY_CARE_PROVIDER_SITE_OTHER): Payer: No Typology Code available for payment source | Admitting: Family Medicine

## 2020-07-29 VITALS — BP 130/88 | HR 62 | Temp 97.8°F | Ht 62.0 in | Wt 201.4 lb

## 2020-07-29 DIAGNOSIS — Z6836 Body mass index (BMI) 36.0-36.9, adult: Secondary | ICD-10-CM | POA: Diagnosis not present

## 2020-07-29 DIAGNOSIS — E782 Mixed hyperlipidemia: Secondary | ICD-10-CM | POA: Diagnosis not present

## 2020-07-29 DIAGNOSIS — E039 Hypothyroidism, unspecified: Secondary | ICD-10-CM

## 2020-07-29 DIAGNOSIS — Z8619 Personal history of other infectious and parasitic diseases: Secondary | ICD-10-CM | POA: Diagnosis not present

## 2020-07-29 DIAGNOSIS — Z01419 Encounter for gynecological examination (general) (routine) without abnormal findings: Secondary | ICD-10-CM | POA: Insufficient documentation

## 2020-07-29 DIAGNOSIS — Z1231 Encounter for screening mammogram for malignant neoplasm of breast: Secondary | ICD-10-CM

## 2020-07-29 DIAGNOSIS — Z01411 Encounter for gynecological examination (general) (routine) with abnormal findings: Secondary | ICD-10-CM | POA: Diagnosis not present

## 2020-07-29 DIAGNOSIS — L709 Acne, unspecified: Secondary | ICD-10-CM

## 2020-07-29 HISTORY — DX: Hypothyroidism, unspecified: E03.9

## 2020-07-29 MED ORDER — TRETINOIN 0.1 % EX CREA: TOPICAL_CREAM | Freq: Every day | CUTANEOUS | 0 refills | Status: DC

## 2020-07-29 NOTE — Progress Notes (Addendum)
NOELA BROTHERS is a 44 y.o. female presents to office today for annual physical exam examination.    Concerns today include:  1. Endocrinology referral She would like a referral to a different endocrinologist. She saw the one here in [place] but did not have a good experience. She is not currently taking any thyroid medication. She would like her TSH and TPO checked today. She has not had the thyroid US done yet that was ordered by the endocrinologist.   2. Stepped on rusty nail Surena reports stepping on a rusty nail with her left foot 3 week ago. She had a tetnas shot right before she happened. She reports some residual pain and numbness in her pinky toe since. Denies fever, redness, tenderness, drainage, open wound, or decreased ROM.    3. Acne Reports acne on her face. She has tried and failed multiple OTC treatments. It seems to be concentrated where her mask covers.   She has an upcoming GI appointment for frank blood in her stool.   Occupation: sterile processsing, Marital status: single, Substance use: denies Diet: generally healthy, Exercise: active at work Last eye exam: not recently Last dental exam: not recently Last mammogram: 2 years ago Last pap smear: 2 years ago, history of removal of precancerous cells and HPV Refills needed today: none   Past Medical History:  Diagnosis Date  . Anemia   . Hypothyroid 2020  . Hypothyroidism 07/29/2020  . Thyroid disease    Social History   Socioeconomic History  . Marital status: Legally Separated    Spouse name: Not on file  . Number of children: Not on file  . Years of education: Not on file  . Highest education level: Not on file  Occupational History  . Not on file  Tobacco Use  . Smoking status: Former Smoker    Types: Cigarettes    Quit date: 08/17/2010    Years since quitting: 9.9  . Smokeless tobacco: Never Used  Vaping Use  . Vaping Use: Never used  Substance and Sexual Activity  . Alcohol use: Yes     Comment: socially  . Drug use: Never  . Sexual activity: Not on file  Other Topics Concern  . Not on file  Social History Narrative  . Not on file   Social Determinants of Health   Financial Resource Strain: Not on file  Food Insecurity: Not on file  Transportation Needs: Not on file  Physical Activity: Not on file  Stress: Not on file  Social Connections: Not on file  Intimate Partner Violence: Not on file   Past Surgical History:  Procedure Laterality Date  . CHOLECYSTECTOMY    . NECK SURGERY     Family History  Problem Relation Age of Onset  . Healthy Mother   . Healthy Father   . Thyroid disease Maternal Grandmother   . Cancer Paternal Grandmother        abdominal    Current Outpatient Medications:  .  Calcium-Magnesium 100-50 MG TABS, Take by mouth., Disp: , Rfl:  .  cholecalciferol (VITAMIN D3) 25 MCG (1000 UNIT) tablet, Take 1,000 Units by mouth daily., Disp: , Rfl:  .  ferrous sulfate 325 (65 FE) MG EC tablet, Take 325 mg by mouth 3 (three) times daily with meals., Disp: , Rfl:  .  IODINE-VITAMIN A PO, Take by mouth daily., Disp: , Rfl:  .  L-TYROSINE PO, Take by mouth daily., Disp: , Rfl:  .  Menaquinone-7 (VITAMIN K2 PO),  Take by mouth daily., Disp: , Rfl:  .  Omega-3 Fatty Acids (OMEGA-3 FISH OIL PO), Take by mouth daily., Disp: , Rfl:  .  phentermine 37.5 MG capsule, Take 37.5 mg by mouth every morning. RX by bariatric clinic, Disp: , Rfl:  .  SELENIUM PO, Take by mouth daily., Disp: , Rfl:   Allergies  Allergen Reactions  . Other Shortness Of Breath    Avoids ghost peppers  . Penicillins      ROS: Review of Systems Pertinent items noted in HPI and remainder of comprehensive ROS otherwise negative.    Physical exam BP 130/88   Pulse 62   Temp 97.8 F (36.6 C) (Temporal)   Ht 5\' 2"  (1.575 m)   Wt 201 lb 6 oz (91.3 kg)   BMI 36.83 kg/m  General appearance: alert and cooperative Head: Normocephalic, without obvious abnormality,  atraumatic Eyes: conjunctivae/corneas clear. PERRL, EOM's intact.  Ears: normal TM's and external ear canals both ears Nose: Nares normal. Septum midline. Mucosa normal. No drainage or sinus tenderness. Throat: lips, mucosa, and tongue normal; teeth and gums normal Neck: no adenopathy, no carotid bruit, no JVD, supple, symmetrical, trachea midline and thyroid not enlarged, symmetric, no tenderness/mass/nodules Lungs: clear to auscultation bilaterally Breasts: normal appearance, no masses or tenderness Heart: regular rate and rhythm, S1, S2 normal, no murmur, click, rub or gallop Abdomen: soft, non-tender; bowel sounds normal; no masses,  no organomegaly Pelvic: cervix normal in appearance, external genitalia normal, no adnexal masses or tenderness, no cervical motion tenderness and vagina normal without discharge Extremities: extremities normal, atraumatic, no cyanosis or edema Pulses: 2+ and symmetric Skin: Skin color, texture, turgor normal. No rashes or lesions Lymph nodes: Cervical, supraclavicular, and axillary nodes normal. Neurologic: Alert and oriented X 3, normal strength and tone. Normal symmetric reflexes. Normal coordination and gait    Assessment/ Plan: Domingo Dimes here for annual physical exam.   Counseled on healthy lifestyle choices, including diet (rich in fruits, vegetables and lean meats and low in salt and simple carbohydrates) and exercise (at least 30 minutes of moderate physical activity daily).  Patient to follow up in 1 year for annual exam or sooner if needed.  Claudell was seen today for annual exam.  Diagnoses and all orders for this visit:  Encounter for well woman exam with routine gynecological exam -     Cytology - PAP  History of HPV infection  BMI 36.0-36.9,adult  Mixed hyperlipidemia Lifestyle changes. Taking fish oil supplement.   Acne, unspecified acne type -     tretinoin (RETIN-A) 0.1 % cream; Apply topically at bedtime.  Acquired  hypothyroidism Referral to new endocrinologist per patient request. Ladina will not take levothyroxine as it causes fluid rentention per her report. She is taking thyroid supplements.Her last TSH with her endo appointment was 18.8.  She was advised to discontinue the thyroid supplements. A thyroid ultrasound was ordered, but has not been scheduled yet by patient. TSH from this visit was 22. Dr. Liliane Channel office reached out to patient, but she declined appointment.  -     Ambulatory referral to Endocrinology -     Thyroid Panel With TSH -     Thyroid antibodies  Encounter for screening mammogram for malignant neoplasm of breast -     MM Digital Screening; Future  The above assessment and management plan was discussed with the patient. The patient verbalized understanding of and has agreed to the management plan. Patient is aware to call the clinic  if symptoms persist or worsen. Patient is aware when to return to the clinic for a follow-up visit. Patient educated on when it is appropriate to go to the emergency department.   Marjorie Smolder, FNP-C [facility] [address] [place], [place] [address] [phone]

## 2020-07-29 NOTE — Patient Instructions (Addendum)
Health Maintenance, Female Adopting a healthy lifestyle and getting preventive care are important in promoting health and wellness. Ask your health care provider about:  The right schedule for you to have regular tests and exams.  Things you can do on your own to prevent diseases and keep yourself healthy. What should I know about diet, weight, and exercise? Eat a healthy diet   Eat a diet that includes plenty of vegetables, fruits, low-fat dairy products, and lean protein.  Do not eat a lot of foods that are high in solid fats, added sugars, or sodium. Maintain a healthy weight Body mass index (BMI) is used to identify weight problems. It estimates body fat based on height and weight. Your health care provider can help determine your BMI and help you achieve or maintain a healthy weight. Get regular exercise Get regular exercise. This is one of the most important things you can do for your health. Most adults should:  Exercise for at least 150 minutes each week. The exercise should increase your heart rate and make you sweat (moderate-intensity exercise).  Do strengthening exercises at least twice a week. This is in addition to the moderate-intensity exercise.  Spend less time sitting. Even light physical activity can be beneficial. Watch cholesterol and blood lipids Have your blood tested for lipids and cholesterol at 46 years of age, then have this test every 5 years. Have your cholesterol levels checked more often if:  Your lipid or cholesterol levels are high.  You are older than 46 years of age.  You are at high risk for heart disease. What should I know about cancer screening? Depending on your health history and family history, you may need to have cancer screening at various ages. This may include screening for:  Breast cancer.  Cervical cancer.  Colorectal cancer.  Skin cancer.  Lung cancer. What should I know about heart disease, diabetes, and high blood  pressure? Blood pressure and heart disease  High blood pressure causes heart disease and increases the risk of stroke. This is more likely to develop in people who have high blood pressure readings, are of African descent, or are overweight.  Have your blood pressure checked: ? Every 3-5 years if you are 55-76 years of age. ? Every year if you are 33 years old or older. Diabetes Have regular diabetes screenings. This checks your fasting blood sugar level. Have the screening done:  Once every three years after age 71 if you are at a normal weight and have a low risk for diabetes.  More often and at a younger age if you are overweight or have a high risk for diabetes. What should I know about preventing infection? Hepatitis B If you have a higher risk for hepatitis B, you should be screened for this virus. Talk with your health care provider to find out if you are at risk for hepatitis B infection. Hepatitis C Testing is recommended for:  Everyone born from [birth year] through 1967.  Anyone with known risk factors for hepatitis C. Sexually transmitted infections (STIs)  Get screened for STIs, including gonorrhea and chlamydia, if: ? You are sexually active and are younger than 46 years of age. ? You are older than 46 years of age and your health care provider tells you that you are at risk for this type of infection. ? Your sexual activity has changed since you were last screened, and you are at increased risk for chlamydia or gonorrhea. Ask your health care provider if  you are at risk.  Ask your health care provider about whether you are at high risk for HIV. Your health care provider may recommend a prescription medicine to help prevent HIV infection. If you choose to take medicine to prevent HIV, you should first get tested for HIV. You should then be tested every 3 months for as long as you are taking the medicine. Pregnancy  If you are about to stop having your period (premenopausal) and  you may become pregnant, seek counseling before you get pregnant.  Take 400 to 800 micrograms (mcg) of folic acid every day if you become pregnant.  Ask for birth control (contraception) if you want to prevent pregnancy. Osteoporosis and menopause Osteoporosis is a disease in which the bones lose minerals and strength with aging. This can result in bone fractures. If you are 74 years old or older, or if you are at risk for osteoporosis and fractures, ask your health care provider if you should:  Be screened for bone loss.  Take a calcium or vitamin D supplement to lower your risk of fractures.  Be given hormone replacement therapy (HRT) to treat symptoms of menopause. Follow these instructions at home: Lifestyle  Do not use any products that contain nicotine or tobacco, such as cigarettes, e-cigarettes, and chewing tobacco. If you need help quitting, ask your health care provider.  Do not use street drugs.  Do not share needles.  Ask your health care provider for help if you need support or information about quitting drugs. Alcohol use  Do not drink alcohol if: ? Your health care provider tells you not to drink. ? You are pregnant, may be pregnant, or are planning to become pregnant.  If you drink alcohol: ? Limit how much you use to 0-1 drink a day. ? Limit intake if you are breastfeeding.  Be aware of how much alcohol is in your drink. In the [place] one drink equals one 12 oz bottle of beer (355 mL), one 5 oz glass of wine (148 mL), or one 1 oz glass of hard liquor (44 mL). General instructions  Schedule regular health, dental, and eye exams.  Stay current with your vaccines.  Tell your health care provider if: ? You often feel depressed. ? You have ever been abused or do not feel safe at home. Summary  Adopting a healthy lifestyle and getting preventive care are important in promoting health and wellness.  Follow your health care provider's instructions about healthy  diet, exercising, and getting tested or screened for diseases.  Follow your health care provider's instructions on monitoring your cholesterol and blood pressure. This information is not intended to replace advice given to you by your health care provider. Make sure you discuss any questions you have with your health care provider. Document Revised: 07/27/2018 Document Reviewed: 07/27/2018 Elsevier Patient Education  2020 [employer].     Why follow it? Research shows. . Those who follow the Mediterranean diet have a reduced risk of heart disease  . The diet is associated with a reduced incidence of Parkinson's and Alzheimer's diseases . People following the diet may have longer life expectancies and lower rates of chronic diseases  . The Dietary Guidelines for Americans recommends the Mediterranean diet as an eating plan to promote health and prevent disease  What Is the Mediterranean Diet?  . Healthy eating plan based on typical foods and recipes of Mediterranean-style cooking . The diet is primarily a plant based diet; these foods should make up  a majority of meals   Starches - Plant based foods should make up a majority of meals - They are an important sources of vitamins, minerals, energy, antioxidants, and fiber - Choose whole grains, foods high in fiber and minimally processed items  - Typical grain sources include wheat, oats, barley, corn, brown rice, bulgar, farro, millet, polenta, couscous  - Various types of beans include chickpeas, lentils, fava beans, black beans, white beans   Fruits  Veggies - Large quantities of antioxidant rich fruits & veggies; 6 or more servings  - Vegetables can be eaten raw or lightly drizzled with oil and cooked  - Vegetables common to the traditional Mediterranean Diet include: artichokes, arugula, beets, broccoli, brussel sprouts, cabbage, carrots, celery, collard greens, cucumbers, eggplant, kale, leeks, lemons, lettuce, mushrooms, okra, onions,  peas, peppers, potatoes, pumpkin, radishes, rutabaga, shallots, spinach, sweet potatoes, turnips, zucchini - Fruits common to the Mediterranean Diet include: apples, apricots, avocados, cherries, clementines, dates, figs, grapefruits, grapes, melons, nectarines, oranges, peaches, pears, pomegranates, strawberries, tangerines  Fats - Replace butter and margarine with healthy oils, such as olive oil, canola oil, and tahini  - Limit nuts to no more than a handful a day  - Nuts include walnuts, almonds, pecans, pistachios, pine nuts  - Limit or avoid candied, honey roasted or heavily salted nuts - Olives are central to the [employer] - can be eaten whole or used in a variety of dishes   Meats Protein - Limiting red meat: no more than a few times a month - When eating red meat: choose lean cuts and keep the portion to the size of deck of cards - Eggs: approx. 0 to 4 times a week  - Fish and lean poultry: at least 2 a week  - Healthy protein sources include, chicken, [place], lean beef, lamb - Increase intake of seafood such as tuna, salmon, trout, mackerel, shrimp, scallops - Avoid or limit high fat processed meats such as sausage and bacon  Dairy - Include moderate amounts of low fat dairy products  - Focus on healthy dairy such as fat free yogurt, skim milk, low or reduced fat cheese - Limit dairy products higher in fat such as whole or 2% milk, cheese, ice cream  Alcohol - Moderate amounts of red wine is ok  - No more than 5 oz daily for women (all ages) and men older than age 60  - No more than 10 oz of wine daily for men younger than 34  Other - Limit sweets and other desserts  - Use herbs and spices instead of salt to flavor foods  - Herbs and spices common to the traditional Mediterranean Diet include: basil, bay leaves, chives, cloves, cumin, fennel, garlic, lavender, marjoram, mint, oregano, parsley, pepper, rosemary, sage, savory, sumac, tarragon, thyme   It's not just a diet,  it's a lifestyle:  . The Mediterranean diet includes lifestyle factors typical of those in the region  . Foods, drinks and meals are best eaten with others and savored . Daily physical activity is important for overall good health . This could be strenuous exercise like running and aerobics . This could also be more leisurely activities such as walking, housework, yard-work, or taking the stairs . Moderation is the key; a balanced and healthy diet accommodates most foods and drinks . Consider portion sizes and frequency of consumption of certain foods   Meal Ideas & Options:  . Breakfast:  o Whole wheat toast or whole wheat English muffins with  peanut butter & hard boiled egg o Steel cut oats topped with apples & cinnamon and skim milk  o Fresh fruit: banana, strawberries, melon, berries, peaches  o Smoothies: strawberries, bananas, greek yogurt, peanut butter o Low fat greek yogurt with blueberries and granola  o Egg white omelet with spinach and mushrooms o Breakfast couscous: whole wheat couscous, apricots, skim milk, cranberries  . Sandwiches:  o Hummus and grilled vegetables (peppers, zucchini, squash) on whole wheat bread   o Grilled chicken on whole wheat pita with lettuce, tomatoes, cucumbers or tzatziki  o Tuna salad on whole wheat bread: tuna salad made with greek yogurt, olives, red peppers, capers, green onions o Garlic rosemary lamb pita: lamb sauted with garlic, rosemary, salt & pepper; add lettuce, cucumber, greek yogurt to pita - flavor with lemon juice and black pepper  . Seafood:  o Mediterranean grilled salmon, seasoned with garlic, basil, parsley, lemon juice and black pepper o Shrimp, lemon, and spinach whole-grain pasta salad made with low fat greek yogurt  o Seared scallops with lemon orzo  o Seared tuna steaks seasoned salt, pepper, coriander topped with tomato mixture of olives, tomatoes, olive oil, minced garlic, parsley, green onions and cappers  . Meats:   o Herbed greek chicken salad with kalamata olives, cucumber, feta  o Red bell peppers stuffed with spinach, bulgur, lean ground beef (or lentils) & topped with feta   o Kebabs: skewers of chicken, tomatoes, onions, zucchini, squash  o [place] burgers: made with red onions, mint, dill, lemon juice, feta cheese topped with roasted red peppers . Vegetarian o Cucumber salad: cucumbers, artichoke hearts, celery, red onion, feta cheese, tossed in olive oil & lemon juice  o Hummus and whole grain pita points with a greek salad (lettuce, tomato, feta, olives, cucumbers, red onion) o Lentil soup with celery, carrots made with vegetable broth, garlic, salt and pepper  o Tabouli salad: parsley, bulgur, mint, scallions, cucumbers, tomato, radishes, lemon juice, olive oil, salt and pepper.

## 2020-07-30 ENCOUNTER — Telehealth: Payer: Self-pay | Admitting: "Endocrinology

## 2020-07-30 ENCOUNTER — Telehealth: Payer: Self-pay

## 2020-07-30 LAB — THYROID ANTIBODIES
Thyroglobulin Antibody: <1 IU/mL (ref 0.0–0.9)
Thyroperoxidase Ab SerPl-aCnc: 292 IU/mL — ABNORMAL HIGH (ref 0–34)

## 2020-07-30 LAB — THYROID PANEL WITH TSH
Free Thyroxine Index: 1.6 (ref 1.2–4.9)
T3 Uptake Ratio: 27 % (ref 24–39)
T4, Total: 5.9 ug/dL (ref 4.5–12.0)
TSH: 22.6 u[IU]/mL — ABNORMAL HIGH (ref 0.450–4.500)

## 2020-07-30 NOTE — Telephone Encounter (Signed)
Pt called and said that she is not happy with this office service and that she does not want any future appt here.

## 2020-07-30 NOTE — Telephone Encounter (Signed)
Spoke with Donna Lynch this am and she is not happy with [facility].  She has asked her PCP to send her to another Endo office.  I told her with her blood work was abnormal she needed a follow up asap.  She said she had talked with her PCP and they are doing another referral.  I said again this appointment needs to be asap due to abnormal blood work and offered her another appt with Dr Dorris Fetch and she said now.  She said she is following up at another office and did not want to come back to [place].

## 2020-08-01 ENCOUNTER — Other Ambulatory Visit: Payer: Self-pay | Admitting: Family Medicine

## 2020-08-01 ENCOUNTER — Telehealth: Payer: Self-pay

## 2020-08-01 LAB — CYTOLOGY - PAP

## 2020-08-01 NOTE — Telephone Encounter (Signed)
I tried to call the patient back and let her know her referral is in and if she needs to check on the date of the appt please call;  [facility] Address: [address] [place] [address] Phone Number: [phone]  I will try to reach her again.

## 2020-08-01 NOTE — Telephone Encounter (Signed)
1-Patient called upset that she has not been referred to Endo in [place] yet.  I checked her chart and it is in her chart that the referral has been sent.  I am calling the patient back with the # to the office referred to.   2-She is also upset that she has a no show on her account for 07/23/20 and she said the staff did not tell her about this appointment.  I will have the no show removed.

## 2020-08-02 ENCOUNTER — Other Ambulatory Visit: Payer: Self-pay | Admitting: Family Medicine

## 2020-08-02 DIAGNOSIS — Z8619 Personal history of other infectious and parasitic diseases: Secondary | ICD-10-CM

## 2020-08-02 DIAGNOSIS — R87612 Low grade squamous intraepithelial lesion on cytologic smear of cervix (LGSIL): Secondary | ICD-10-CM

## 2020-08-04 ENCOUNTER — Encounter: Payer: Self-pay | Admitting: Family Medicine

## 2020-08-20 ENCOUNTER — Ambulatory Visit: Payer: BC Managed Care – PPO | Admitting: Gastroenterology

## 2020-08-20 ENCOUNTER — Encounter: Payer: Self-pay | Admitting: Internal Medicine

## 2020-10-07 ENCOUNTER — Telehealth: Payer: Self-pay | Admitting: *Deleted

## 2020-10-07 NOTE — Telephone Encounter (Signed)
Called pt to try and have her call GYN back to schedule her appt as they have tried multiple times to contact her to schedule. Pt picked the phone up and I informed her to call gyn to schedule and she  said "can you call me back I worked all night and hung up".

## 2020-10-16 NOTE — Telephone Encounter (Signed)
Closing encounters

## 2020-10-23 ENCOUNTER — Ambulatory Visit: Payer: No Typology Code available for payment source | Admitting: Internal Medicine

## 2020-10-23 NOTE — Progress Notes (Deleted)
Name: Donna Lynch  MRN/ DOB: [id number], November 20, 1975    Age/ Sex: 45 y.o., female    PCP: Gwenlyn Perking, FNP   Reason for Endocrinology Evaluation: Hashimoto's Thyroiditis      Date of Initial Endocrinology Evaluation: 10/23/2020     HPI: Donna Lynch is a 45 y.o. female with a past medical history of ***. The patient presented for initial endocrinology clinic visit on 10/23/2020 for consultative assistance with her Hashimoto's Thyroiditis .   She has been diagnosed with hypothyroidism secondary to Hashimoto's Thyroiditis in 06/2020 with a TSH max of 22.6 uIU/mL, and Anti-TPO Ab of 292 IU/mL      She was seen by Dr. Dorris Fetch in 06/2020.  HISTORY:  Past Medical History:  Past Medical History:  Diagnosis Date  . Anemia   . Hypothyroid 2020  . Hypothyroidism 07/29/2020  . Thyroid disease     Past Surgical History:  Past Surgical History:  Procedure Laterality Date  . CHOLECYSTECTOMY    . NECK SURGERY        Social History:  reports that she quit smoking about 10 years ago. Her smoking use included cigarettes. She has never used smokeless tobacco. She reports current alcohol use. She reports that she does not use drugs.  Family History: family history includes Cancer in her paternal grandmother; Healthy in her father and mother; Thyroid disease in her maternal grandmother.   HOME MEDICATIONS: Allergies as of 10/23/2020      Reactions   Other Shortness Of Breath   Avoids ghost peppers   Penicillins       Medication List       Accurate as of October 23, 2020  7:10 AM. If you have any questions, ask your nurse or doctor.        Calcium-Magnesium 100-50 MG Tabs Take by mouth.   cholecalciferol 25 MCG (1000 UNIT) tablet Commonly known as: VITAMIN D3 Take 1,000 Units by mouth daily.   ferrous sulfate 325 (65 FE) MG EC tablet Take 325 mg by mouth 3 (three) times daily with meals.   IODINE-VITAMIN A PO Take by mouth daily.   L-TYROSINE PO Take by mouth  daily.   OMEGA-3 FISH OIL PO Take by mouth daily.   phentermine 37.5 MG capsule Take 37.5 mg by mouth every morning. RX by bariatric clinic   SELENIUM PO Take by mouth daily.   tretinoin 0.1 % cream Commonly known as: RETIN-A Apply topically at bedtime.   VITAMIN K2 PO Take by mouth daily.         REVIEW OF SYSTEMS: A comprehensive ROS was conducted with the patient and is negative except as per HPI and below:  ROS     OBJECTIVE:  VS: There were no vitals taken for this visit.   Wt Readings from Last 3 Encounters:  07/29/20 201 lb 6 oz (91.3 kg)  07/02/20 205 lb 6.4 oz (93.2 kg)  06/28/20 202 lb 4 oz (91.7 kg)     EXAM: General: Pt appears well and is in NAD  Hydration: Well-hydrated with moist mucous membranes and good skin turgor  Eyes: External eye exam normal without stare, lid lag or exophthalmos.  EOM intact.  PERRL.  Ears, Nose, Throat: Hearing: Grossly intact bilaterally Dental: Good dentition  Throat: Clear without mass, erythema or exudate  Neck: General: Supple without adenopathy. Thyroid: Thyroid size normal.  No goiter or nodules appreciated. No thyroid bruit.  Lungs: Clear with good BS bilat with no  rales, rhonchi, or wheezes  Heart: Auscultation: RRR.  Abdomen: Normoactive bowel sounds, soft, nontender, without masses or organomegaly palpable  Extremities: Gait and station: Normal gait  Digits and nails: No clubbing, cyanosis, petechiae, or nodes Head and neck: Normal alignment and mobility BL UE: Normal ROM and strength. BL LE: No pretibial edema normal ROM and strength.  Skin: Hair: Texture and amount normal with gender appropriate distribution Skin Inspection: No rashes, acanthosis nigricans/skin tags. No lipohypertrophy Skin Palpation: Skin temperature, texture, and thickness normal to palpation  Neuro: Cranial nerves: II - XII grossly intact  Cerebellar: Normal coordination and movement; no tremor Motor: Normal strength throughout DTRs:  2+ and symmetric in UE without delay in relaxation phase  Mental Status: Judgment, insight: Intact Orientation: Oriented to time, place, and person Memory: Intact for recent and remote events Mood and affect: No depression, anxiety, or agitation     DATA REVIEWED: ***    ASSESSMENT/PLAN/RECOMMENDATIONS:   1. ***    Medications :  Signed electronically by: Mack Guise, MD  [facility]  [facility] [address] [place], [place] [address] Phone: [phone] FAX: [phone]   CC: Gwenlyn Perking, [place] [address] Phone: [phone] Fax: [phone]   Return to Endocrinology clinic as below: Future Appointments  Date Time Provider [place]  10/23/2020 10:30 AM Shamleffer, Melanie Crazier, MD [facility] None  11/13/2020 10:40 AM [facility] 1 [facility]

## 2020-11-08 ENCOUNTER — Encounter: Payer: Self-pay | Admitting: Family Medicine

## 2020-11-08 ENCOUNTER — Ambulatory Visit (INDEPENDENT_AMBULATORY_CARE_PROVIDER_SITE_OTHER): Payer: No Typology Code available for payment source | Admitting: Family Medicine

## 2020-11-08 DIAGNOSIS — R062 Wheezing: Secondary | ICD-10-CM

## 2020-11-08 DIAGNOSIS — U071 COVID-19: Secondary | ICD-10-CM

## 2020-11-08 MED ORDER — ALBUTEROL SULFATE HFA 108 (90 BASE) MCG/ACT IN AERS: 2.0000 | INHALATION_SPRAY | Freq: Four times a day (QID) | RESPIRATORY_TRACT | 2 refills | Status: DC | PRN

## 2020-11-08 MED ORDER — BENZONATATE 100 MG PO CAPS: 100.0000 mg | ORAL_CAPSULE | Freq: Three times a day (TID) | ORAL | 0 refills | Status: DC | PRN

## 2020-11-08 MED ORDER — CHLORPHEN-PE-ACETAMINOPHEN 4-10-325 MG PO TABS
1.0000 | ORAL_TABLET | Freq: Four times a day (QID) | ORAL | 0 refills | Status: DC | PRN
Start: 2020-11-08 — End: 2021-07-28

## 2020-11-08 MED ORDER — PREDNISONE 20 MG PO TABS: 20.0000 mg | ORAL_TABLET | Freq: Every day | ORAL | 0 refills | Status: DC

## 2020-11-08 NOTE — Progress Notes (Signed)
Virtual Visit via telephone Note Due to COVID-19 pandemic this visit was conducted virtually. This visit type was conducted due to national recommendations for restrictions regarding the COVID-19 Pandemic (e.g. social distancing, sheltering in place) in an effort to limit this patient's exposure and mitigate transmission in our community. All issues noted in this document were discussed and addressed.  A physical exam was not performed with this format.  I connected with Donna Lynch on 11/08/20 at 1310 by telephone and verified that I am speaking with the correct person using two identifiers. Donna Lynch is currently located at home and no one is currently with her during the visit. The provider, Gwenlyn Perking, FNP is located in their office at time of visit.  I discussed the limitations, risks, security and privacy concerns of performing an evaluation and management service by telephone and the availability of in person appointments. I also discussed with the patient that there may be a patient responsible charge related to this service. The patient expressed understanding and agreed to proceed.  CC: Covid infection  History and Present Illness:  HPI  Donna Lynch reports a positive home Covid test last night. Her symptoms started on Tuesday. She has a productive cough, head congestion, sore throat, and fatigue. She did have body aches yesterday and some shortness of breath when air from the [facility] was in her face. Denies shortness of breath today. She has had some wheezing at times. She has been vaccinated, but has not had a booster shot. She has been taking mucinex for her symptoms with a little relief.   ROS As per HPI.   Observations/Objective: Alert and oriented x 3. Able to speak in full sentences without difficulty.   Assessment and Plan: Donna Lynch was seen today for covid positive.  Diagnoses and all orders for this visit:  COVID-19 Discussed quarantine. Prednisone burst given.  Tessalon perles for cough. Noral AD Rx sent for congestion, take mucinex in unable to get. Nasal saline for congestion. Albuteral as needed for wheezing. Push fluids, rest. Patient aware of when to seek emergency care.  -     predniSONE (DELTASONE) 20 MG tablet; Take 1 tablet (20 mg total) by mouth daily with breakfast. -     benzonatate (TESSALON PERLES) 100 MG capsule; Take 1 capsule (100 mg total) by mouth 3 (three) times daily as needed for cough. -     Chlorphen-PE-Acetaminophen 4-10-325 MG TABS; Take 1 tablet by mouth every 6 (six) hours as needed.  Wheezing -     albuterol (VENTOLIN HFA) 108 (90 Base) MCG/ACT inhaler; Inhale 2 puffs into the lungs every 6 (six) hours as needed for wheezing or shortness of breath.  Follow Up Instructions: Return to office for new or worsening symptoms, or if symptoms persist.      I discussed the assessment and treatment plan with the patient. The patient was provided an opportunity to ask questions and all were answered. The patient agreed with the plan and demonstrated an understanding of the instructions.   The patient was advised to call back or seek an in-person evaluation if the symptoms worsen or if the condition fails to improve as anticipated.  The above assessment and management plan was discussed with the patient. The patient verbalized understanding of and has agreed to the management plan. Patient is aware to call the clinic if symptoms persist or worsen. Patient is aware when to return to the clinic for a follow-up visit. Patient educated on when it is  appropriate to go to the emergency department.   Time call ended:  1321  I provided 11 minutes of phone time during this encounter.    Gwenlyn Perking, FNP

## 2021-05-06 ENCOUNTER — Ambulatory Visit (INDEPENDENT_AMBULATORY_CARE_PROVIDER_SITE_OTHER): Payer: No Typology Code available for payment source | Admitting: Nurse Practitioner

## 2021-05-06 ENCOUNTER — Encounter: Payer: Self-pay | Admitting: Nurse Practitioner

## 2021-05-06 DIAGNOSIS — U071 COVID-19: Secondary | ICD-10-CM | POA: Diagnosis not present

## 2021-05-06 DIAGNOSIS — J069 Acute upper respiratory infection, unspecified: Secondary | ICD-10-CM | POA: Insufficient documentation

## 2021-05-06 DIAGNOSIS — J0110 Acute frontal sinusitis, unspecified: Secondary | ICD-10-CM | POA: Diagnosis not present

## 2021-05-06 DIAGNOSIS — R059 Cough, unspecified: Secondary | ICD-10-CM | POA: Insufficient documentation

## 2021-05-06 MED ORDER — BENZONATATE 100 MG PO CAPS: 100.0000 mg | ORAL_CAPSULE | Freq: Three times a day (TID) | ORAL | 0 refills | Status: DC | PRN

## 2021-05-06 MED ORDER — DOXYCYCLINE HYCLATE 100 MG PO TABS: 100.0000 mg | ORAL_TABLET | Freq: Two times a day (BID) | ORAL | 0 refills | Status: DC

## 2021-05-06 MED ORDER — SALINE SPRAY 0.65 % NA SOLN: 1.0000 | NASAL | 0 refills | Status: DC | PRN

## 2021-05-06 MED ORDER — PREDNISONE 10 MG (21) PO TBPK: ORAL_TABLET | ORAL | 0 refills | Status: DC

## 2021-05-06 NOTE — Progress Notes (Signed)
   Virtual Visit  Note Due to COVID-19 pandemic this visit was conducted virtually. This visit type was conducted due to national recommendations for restrictions regarding the COVID-19 Pandemic (e.g. social distancing, sheltering in place) in an effort to limit this patient's exposure and mitigate transmission in our community. All issues noted in this document were discussed and addressed.  A physical exam was not performed with this format.  I connected with Donna Lynch on 05/06/21 at 8:30 AM by telephone and verified that I am speaking with the correct person using two identifiers. Donna Lynch is currently located at home during visit. The provider, Ivy Lynn, NP is located in their office at time of visit.  I discussed the limitations, risks, security and privacy concerns of performing an evaluation and management service by telephone and the availability of in person appointments. I also discussed with the patient that there may be a patient responsible charge related to this service. The patient expressed understanding and agreed to proceed.   History and Present Illness:  Sinusitis This is a new problem. The current episode started in the past 7 days. The problem has been gradually worsening since onset. There has been no fever. The pain is moderate. Associated symptoms include congestion, coughing and a hoarse voice. Past treatments include oral decongestants. The treatment provided no relief.     Review of Systems  HENT:  Positive for congestion and hoarse voice.   Respiratory:  Positive for cough.   Genitourinary: Negative.   Musculoskeletal:  Positive for myalgias.  Skin:  Negative for rash.  All other systems reviewed and are negative.     Observations/Objective: Televisit patient not in distress  Assessment and Plan: Take meds as prescribed - Use a cool mist humidifier  -Use saline nose sprays frequently -Force fluids -For fever or aches or pains- take  Tylenol or ibuprofen. COVID-19 swab completed results pending, doxycycline 100 mg tablet by mouth, benzonatate for cough, prednisone taper Follow up with worsening unresolved symptoms   Follow Up Instructions: Follow-up with worsening unresolved symptoms    I discussed the assessment and treatment plan with the patient. The patient was provided an opportunity to ask questions and all were answered. The patient agreed with the plan and demonstrated an understanding of the instructions.   The patient was advised to call back or seek an in-person evaluation if the symptoms worsen or if the condition fails to improve as anticipated.  The above assessment and management plan was discussed with the patient. The patient verbalized understanding of and has agreed to the management plan. Patient is aware to call the clinic if symptoms persist or worsen. Patient is aware when to return to the clinic for a follow-up visit. Patient educated on when it is appropriate to go to the emergency department.   Time call ended: 8:40 AM  I provided 10 minutes of  non face-to-face time during this encounter.    Ivy Lynn, NP

## 2021-05-06 NOTE — Assessment & Plan Note (Signed)
Take meds as prescribed - Use a cool mist humidifier  -Use saline nose sprays frequently -Force fluids -For fever or aches or pains- take Tylenol or ibuprofen. COVID-19 swab completed results pending, doxycycline 100 mg tablet by mouth, benzonatate for cough, prednisone taper Follow up with worsening unresolved symptoms

## 2021-05-07 ENCOUNTER — Other Ambulatory Visit: Payer: No Typology Code available for payment source

## 2021-05-08 LAB — SARS-COV-2, NAA 2 DAY TAT

## 2021-05-08 LAB — NOVEL CORONAVIRUS, NAA: SARS-CoV-2, NAA: NOT DETECTED

## 2021-07-24 ENCOUNTER — Ambulatory Visit: Payer: No Typology Code available for payment source | Admitting: Family Medicine

## 2021-07-28 ENCOUNTER — Encounter: Payer: Self-pay | Admitting: Family Medicine

## 2021-07-28 ENCOUNTER — Ambulatory Visit (INDEPENDENT_AMBULATORY_CARE_PROVIDER_SITE_OTHER): Payer: No Typology Code available for payment source | Admitting: Family Medicine

## 2021-07-28 ENCOUNTER — Telehealth: Payer: Self-pay | Admitting: Family Medicine

## 2021-07-28 VITALS — BP 132/90 | HR 70 | Temp 97.9°F | Ht 62.0 in | Wt 202.0 lb

## 2021-07-28 DIAGNOSIS — R202 Paresthesia of skin: Secondary | ICD-10-CM | POA: Diagnosis not present

## 2021-07-28 DIAGNOSIS — R5383 Other fatigue: Secondary | ICD-10-CM

## 2021-07-28 DIAGNOSIS — E039 Hypothyroidism, unspecified: Secondary | ICD-10-CM | POA: Diagnosis not present

## 2021-07-28 NOTE — Telephone Encounter (Signed)
Sent pt mychart message informing that she needs to come by the office to sign a record release. Pt did not answer phone and vmail is full.

## 2021-07-28 NOTE — Patient Instructions (Signed)

## 2021-07-28 NOTE — Progress Notes (Addendum)
Acute Office Visit  Subjective:    Patient ID: Donna Lynch, female    DOB: 02-09-1976, 45 y.o.   MRN: [id number]  Chief Complaint  Patient presents with   Fatigue    HPI Patient is in today for fatigue for 1 year. It has been worse recently. She also reports increased stress. She is also irritable. She also reports palpitations, weight gain, hair loss, and joint pain. She report numbness and tingling all over. She has had intermittent blurred vision. She has a history of hypothyroidism and has been non compliant with treatment for this. She reports that each time she tried levothyroxine, she would have swelling on her knees. For the last few months though, she was taking levothyroxine from [place] but she has been out of this for 2 weeks. She reports that she was taking 75 mcg daily. She did not follow up with endocrinology. She feels like she needs some time off of work to get herself together and her illness under control.   Past Medical History:  Diagnosis Date   Anemia    Hypothyroid 2020   Hypothyroidism 07/29/2020   Thyroid disease     Past Surgical History:  Procedure Laterality Date   CHOLECYSTECTOMY     NECK SURGERY      Family History  Problem Relation Age of Onset   Healthy Mother    Healthy Father    Thyroid disease Maternal Grandmother    Cancer Paternal Grandmother        abdominal    Social History   Socioeconomic History   Marital status: Legally Separated    Spouse name: Not on file   Number of children: Not on file   Years of education: Not on file   Highest education level: Not on file  Occupational History   Not on file  Tobacco Use   Smoking status: Former    Types: Cigarettes    Quit date: 08/17/2010    Years since quitting: 10.9   Smokeless tobacco: Never  Vaping Use   Vaping Use: Never used  Substance and Sexual Activity   Alcohol use: Yes    Comment: socially   Drug use: Never   Sexual activity: Not on file  Other Topics Concern    Not on file  Social History Narrative   Not on file   Social Determinants of Health   Financial Resource Strain: Not on file  Food Insecurity: Not on file  Transportation Needs: Not on file  Physical Activity: Not on file  Stress: Not on file  Social Connections: Not on file  Intimate Partner Violence: Not on file    Outpatient Medications Prior to Visit  Medication Sig Dispense Refill   Calcium-Magnesium 100-50 MG TABS Take by mouth.     cholecalciferol (VITAMIN D3) 25 MCG (1000 UNIT) tablet Take 1,000 Units by mouth daily.     IODINE-VITAMIN A PO Take by mouth daily.     L-TYROSINE PO Take by mouth daily.     Menaquinone-7 (VITAMIN K2 PO) Take by mouth daily.     Omega-3 Fatty Acids (OMEGA-3 FISH OIL PO) Take by mouth daily.     phentermine 37.5 MG capsule Take 37.5 mg by mouth every morning. RX by bariatric clinic     SELENIUM PO Take by mouth daily.     tretinoin (RETIN-A) 0.1 % cream Apply topically at bedtime. 45 g 0   ferrous sulfate 325 (65 FE) MG EC tablet Take 325 mg by mouth  3 (three) times daily with meals. (Patient not taking: Reported on 07/28/2021)     albuterol (VENTOLIN HFA) 108 (90 Base) MCG/ACT inhaler Inhale 2 puffs into the lungs every 6 (six) hours as needed for wheezing or shortness of breath. 8 g 2   benzonatate (TESSALON PERLES) 100 MG capsule Take 1 capsule (100 mg total) by mouth 3 (three) times daily as needed for cough. 20 capsule 0   Chlorphen-PE-Acetaminophen 4-10-325 MG TABS Take 1 tablet by mouth every 6 (six) hours as needed. 20 tablet 0   doxycycline (VIBRA-TABS) 100 MG tablet Take 1 tablet (100 mg total) by mouth 2 (two) times daily. 14 tablet 0   predniSONE (STERAPRED UNI-PAK 21 TAB) 10 MG (21) TBPK tablet 6 tablets day 1, 5 tablet due to, 4 tablet day 3, 3 tablet day 4, 2 tablet day 5, 1 tablet day 6 1 each 0   sodium chloride (OCEAN) 0.65 % SOLN nasal spray Place 1 spray into both nostrils as needed for congestion. 30 mL 0   No  facility-administered medications prior to visit.    Allergies  Allergen Reactions   Other Shortness Of Breath    Avoids ghost peppers   Penicillins     Review of Systems As per HPI.     Objective:    Physical Exam Vitals and nursing note reviewed.  Constitutional:      General: She is not in acute distress.    Appearance: She is not toxic-appearing or diaphoretic.  Eyes:     Extraocular Movements: Extraocular movements intact.     Conjunctiva/sclera: Conjunctivae normal.  Neck:     Thyroid: No thyroid mass, thyromegaly or thyroid tenderness.  Cardiovascular:     Rate and Rhythm: Normal rate and regular rhythm.     Heart sounds: Normal heart sounds. No murmur heard. Pulmonary:     Effort: Pulmonary effort is normal. No respiratory distress.     Breath sounds: Normal breath sounds.  Abdominal:     General: Bowel sounds are normal. There is no distension.     Palpations: Abdomen is soft.     Tenderness: There is no abdominal tenderness. There is no guarding or rebound.  Musculoskeletal:     Right lower leg: No edema.     Left lower leg: No edema.  Skin:    General: Skin is warm.  Neurological:     General: No focal deficit present.     Mental Status: She is alert and oriented to person, place, and time.  Psychiatric:        Attention and Perception: Attention normal.        Mood and Affect: Affect is tearful.        Speech: Speech normal.    BP 132/90   Pulse 70   Temp 97.9 F (36.6 C) (Temporal)   Ht _0  (1.575 m)   Wt 202 lb (91.6 kg)   BMI 36.95 kg/m  Wt Readings from Last 3 Encounters:  07/28/21 202 lb (91.6 kg)  07/29/20 201 lb 6 oz (91.3 kg)  07/02/20 205 lb 6.4 oz (93.2 kg)    There are no preventive care reminders to display for this patient.  There are no preventive care reminders to display for this patient.   Lab Results  Component Value Date   TSH 22.600 (H) 07/29/2020   Lab Results  Component Value Date   WBC 3.5 06/28/2020   HGB  11.9 06/28/2020   HCT 37.8 06/28/2020   MCV  83 06/28/2020   PLT 305 06/28/2020   Lab Results  Component Value Date   NA 143 06/28/2020   K 4.5 06/28/2020   CO2 24 06/28/2020   GLUCOSE 93 06/28/2020   BUN 9 06/28/2020   CREATININE 0.67 06/28/2020   BILITOT 0.4 06/28/2020   ALKPHOS 89 06/28/2020   AST 17 06/28/2020   ALT 10 06/28/2020   PROT 6.6 06/28/2020   ALBUMIN 4.3 06/28/2020   CALCIUM 9.0 06/28/2020   Lab Results  Component Value Date   CHOL 209 (H) 06/28/2020   Lab Results  Component Value Date   HDL 52 06/28/2020   Lab Results  Component Value Date   LDLCALC 127 (H) 06/28/2020   Lab Results  Component Value Date   TRIG 170 (H) 06/28/2020   Lab Results  Component Value Date   CHOLHDL 4.0 06/28/2020   No results found for: HGBA1C     Assessment & Plan:   Asianna was seen today for fatigue.  Diagnoses and all orders for this visit:  Acquired hypothyroidism Discussed that her symptoms are all likely related to uncontrolled hypothyroidism as her last TSH was 22 a year ago without follow up. She reports that she has been taking levothyroxine 75 mcg that has been purchased from [place], but has been out of this for 2 weeks. Labs are pending as below. Will order levothyroxine pending results. New referral to endo placed. Discussed importance of follow up and treatment for hypothyroidism. Discussed that she should contact her employer regarding FMLA.  -     Ambulatory referral to Endocrinology -     Thyroid Panel With TSH -     CBC with Differential/Platelet -     CMP14+EGFR  Paresthesia Other fatigue Both are likely due to uncontrolled hypothyroidism. Labs pending as below to assess for other causes.  -     Thyroid Panel With TSH -     CBC with Differential/Platelet -     CMP14+EGFR -     Vitamin B12 -     VITAMIN D 25 Hydroxy (Vit-D Deficiency, Fractures)  Return in about 6 weeks (around 09/08/2021) for TSH.  The patient indicates understanding of these  issues and agrees with the plan.  Gwenlyn Perking, FNP

## 2021-07-29 LAB — CMP14+EGFR
ALT: 12 IU/L (ref 0–32)
AST: 13 IU/L (ref 0–40)
Albumin/Globulin Ratio: 2.3 — ABNORMAL HIGH (ref 1.2–2.2)
Albumin: 4.5 g/dL (ref 3.8–4.8)
Alkaline Phosphatase: 100 IU/L (ref 44–121)
BUN/Creatinine Ratio: 15 (ref 9–23)
BUN: 10 mg/dL (ref 6–24)
Bilirubin Total: 0.5 mg/dL (ref 0.0–1.2)
CO2: 25 mmol/L (ref 20–29)
Calcium: 9 mg/dL (ref 8.7–10.2)
Chloride: 103 mmol/L (ref 96–106)
Creatinine, Ser: 0.66 mg/dL (ref 0.57–1.00)
Globulin, Total: 2 g/dL (ref 1.5–4.5)
Glucose: 109 mg/dL — ABNORMAL HIGH (ref 70–99)
Potassium: 4.3 mmol/L (ref 3.5–5.2)
Sodium: 139 mmol/L (ref 134–144)
Total Protein: 6.5 g/dL (ref 6.0–8.5)
eGFR: 109 mL/min/{1.73_m2} (ref >59)

## 2021-07-29 LAB — VITAMIN D 25 HYDROXY (VIT D DEFICIENCY, FRACTURES): Vit D, 25-Hydroxy: 10.7 ng/mL — ABNORMAL LOW (ref 30.0–100.0)

## 2021-07-29 LAB — CBC WITH DIFFERENTIAL/PLATELET
Basophils Absolute: 0.1 10*3/uL (ref 0.0–0.2)
Basos: 1 %
EOS (ABSOLUTE): 0.3 10*3/uL (ref 0.0–0.4)
Eos: 6 %
Hematocrit: 38.3 % (ref 34.0–46.6)
Hemoglobin: 12.3 g/dL (ref 11.1–15.9)
Immature Grans (Abs): 0 10*3/uL (ref 0.0–0.1)
Immature Granulocytes: 0 %
Lymphocytes Absolute: 1.7 10*3/uL (ref 0.7–3.1)
Lymphs: 34 %
MCH: 26.1 pg — ABNORMAL LOW (ref 26.6–33.0)
MCHC: 32.1 g/dL (ref 31.5–35.7)
MCV: 81 fL (ref 79–97)
Monocytes Absolute: 0.4 10*3/uL (ref 0.1–0.9)
Monocytes: 8 %
Neutrophils Absolute: 2.5 10*3/uL (ref 1.4–7.0)
Neutrophils: 51 %
Platelets: 307 10*3/uL (ref 150–450)
RBC: 4.72 x10E6/uL (ref 3.77–5.28)
RDW: 16.3 % — ABNORMAL HIGH (ref 11.7–15.4)
WBC: 5 10*3/uL (ref 3.4–10.8)

## 2021-07-29 LAB — VITAMIN B12: Vitamin B-12: 612 pg/mL (ref 232–1245)

## 2021-07-29 LAB — THYROID PANEL WITH TSH
Free Thyroxine Index: 2.2 (ref 1.2–4.9)
T3 Uptake Ratio: 29 % (ref 24–39)
T4, Total: 7.7 ug/dL (ref 4.5–12.0)
TSH: 7.91 u[IU]/mL — ABNORMAL HIGH (ref 0.450–4.500)

## 2021-07-30 ENCOUNTER — Other Ambulatory Visit: Payer: Self-pay | Admitting: Family Medicine

## 2021-07-30 DIAGNOSIS — E039 Hypothyroidism, unspecified: Secondary | ICD-10-CM

## 2021-07-30 DIAGNOSIS — E559 Vitamin D deficiency, unspecified: Secondary | ICD-10-CM

## 2021-07-30 MED ORDER — LEVOTHYROXINE SODIUM 100 MCG PO TABS: 100.0000 ug | ORAL_TABLET | Freq: Every day | ORAL | 3 refills | Status: DC

## 2021-07-30 MED ORDER — VITAMIN D (ERGOCALCIFEROL) 1.25 MG (50000 UNIT) PO CAPS: 50000.0000 [IU] | ORAL_CAPSULE | ORAL | 0 refills | Status: DC

## 2021-08-04 ENCOUNTER — Telehealth: Payer: Self-pay | Admitting: Family Medicine

## 2021-08-04 ENCOUNTER — Encounter: Payer: Self-pay | Admitting: Family Medicine

## 2021-08-04 NOTE — Telephone Encounter (Signed)
Pt called to check on status of her FMLA paperwork. Explained to pt that paperwork was faxed on 12/16. Pt wanted to make sure that PCP wrote her out until 09/08/21 as they talked about at visit. Explained to pt that PCP only wrote her out until 12/18. Pt said that is not correct and needs [place] to correct and resend.   Please advise.

## 2021-08-04 NOTE — Telephone Encounter (Signed)
NA/VM full. FMLA ppw updated [place] till 09/08/20 by Tiffany & refaxed to [facility]

## 2021-09-10 ENCOUNTER — Ambulatory Visit (INDEPENDENT_AMBULATORY_CARE_PROVIDER_SITE_OTHER): Payer: No Typology Code available for payment source | Admitting: Family Medicine

## 2021-09-10 ENCOUNTER — Ambulatory Visit: Payer: No Typology Code available for payment source | Admitting: Family Medicine

## 2021-09-10 ENCOUNTER — Encounter: Payer: Self-pay | Admitting: Family Medicine

## 2021-09-10 VITALS — BP 125/86 | HR 70 | Temp 98.9°F | Ht 62.0 in | Wt 214.0 lb

## 2021-09-10 DIAGNOSIS — E039 Hypothyroidism, unspecified: Secondary | ICD-10-CM | POA: Diagnosis not present

## 2021-09-10 NOTE — Progress Notes (Signed)
Subjective:  Patient ID: Donna Lynch, female    DOB: [birth year], 46 y.o.   MRN: [id number]  Patient Care Team: Gwenlyn Perking, FNP as PCP - General (Family Medicine) Eloise Harman, DO as Consulting Physician (Internal Medicine)   Chief Complaint:  Medication Management (Thyroid)   HPI: Donna Lynch is a 46 y.o. female presenting on 09/10/2021 for Medication Management (Thyroid)   Pt presents for a 6 week follow up for hypothyroidism. Last TSH was 7.910 in 12/22. She is currently taking levothyroxine which believes it is making her gain weight and gain fluid on her knees. Her energy is much improved. She desires a return to work note. Her Vit D was low and she is taking once weekly repletion therapy.    There are no diagnoses linked to this encounter.    Relevant past medical, surgical, family, and social history reviewed and updated as indicated.  Allergies and medications reviewed and updated. Data reviewed: Chart in Epic.   Past Medical History:  Diagnosis Date   Anemia    Hypothyroid 2020   Hypothyroidism 07/29/2020   Thyroid disease     Past Surgical History:  Procedure Laterality Date   CHOLECYSTECTOMY     NECK SURGERY      Social History   Socioeconomic History   Marital status: Legally Separated    Spouse name: Not on file   Number of children: Not on file   Years of education: Not on file   Highest education level: Not on file  Occupational History   Not on file  Tobacco Use   Smoking status: Former    Types: Cigarettes    Quit date: 08/17/2010    Years since quitting: 11.0   Smokeless tobacco: Never  Vaping Use   Vaping Use: Never used  Substance and Sexual Activity   Alcohol use: Yes    Comment: socially   Drug use: Never   Sexual activity: Not on file  Other Topics Concern   Not on file  Social History Narrative   Not on file   Social Determinants of Health   Financial Resource Strain: Not on file  Food Insecurity: Not on  file  Transportation Needs: Not on file  Physical Activity: Not on file  Stress: Not on file  Social Connections: Not on file  Intimate Partner Violence: Not on file    Outpatient Encounter Medications as of 09/10/2021  Medication Sig   levothyroxine (SYNTHROID) 100 MCG tablet Take 1 tablet (100 mcg total) by mouth daily.   phentermine 37.5 MG capsule Take 37.5 mg by mouth every morning.   [DISCONTINUED] Calcium-Magnesium 100-50 MG TABS Take by mouth.   [DISCONTINUED] cholecalciferol (VITAMIN D3) 25 MCG (1000 UNIT) tablet Take 1,000 Units by mouth daily.   [DISCONTINUED] ferrous sulfate 325 (65 FE) MG EC tablet Take 325 mg by mouth 3 (three) times daily with meals.   [DISCONTINUED] IODINE-VITAMIN A PO Take by mouth daily.   [DISCONTINUED] L-TYROSINE PO Take by mouth daily.   [DISCONTINUED] Menaquinone-7 (VITAMIN K2 PO) Take by mouth daily.   [DISCONTINUED] Omega-3 Fatty Acids (OMEGA-3 FISH OIL PO) Take by mouth daily.   [DISCONTINUED] phentermine 37.5 MG capsule Take 37.5 mg by mouth every morning. RX by bariatric clinic   [DISCONTINUED] SELENIUM PO Take by mouth daily.   [DISCONTINUED] tretinoin (RETIN-A) 0.1 % cream Apply topically at bedtime.   [DISCONTINUED] Vitamin D, Ergocalciferol, (DRISDOL) 1.25 MG (50000 UNIT) CAPS capsule Take 1 capsule (50,000 Units  total) by mouth every 7 (seven) days.   No facility-administered encounter medications on file as of 09/10/2021.    Allergies  Allergen Reactions   Other Shortness Of Breath    Avoids ghost peppers   Penicillins     Review of Systems  Constitutional:  Positive for fatigue and unexpected weight change. Negative for activity change, appetite change, chills, diaphoresis and fever.  Respiratory:  Negative for cough, chest tightness and shortness of breath.   Cardiovascular:  Negative for chest pain, palpitations and leg swelling.  Gastrointestinal:  Positive for constipation. Negative for abdominal pain, diarrhea, nausea and  vomiting.  Endocrine: Negative for cold intolerance, heat intolerance, polydipsia, polyphagia and polyuria.  Genitourinary:  Negative for decreased urine volume and difficulty urinating.  Musculoskeletal:  Positive for joint swelling. Negative for arthralgias.  Skin:  Negative for color change.  Neurological:  Negative for dizziness, tremors, seizures, syncope, facial asymmetry, speech difficulty, weakness, light-headedness, numbness and headaches.  Psychiatric/Behavioral:  Negative for confusion.   All other systems reviewed and are negative.      Objective:  BP 125/86    Pulse 70    Temp 98.9 F (37.2 C)    Ht 5\' 2"  (1.575 m)    Wt 97.1 kg    SpO2 96%    BMI 39.14 kg/m    Wt Readings from Last 3 Encounters:  09/10/21 97.1 kg  07/28/21 91.6 kg  07/29/20 91.3 kg    Physical Exam Vitals and nursing note reviewed.  Constitutional:      Appearance: Normal appearance. She is obese.  HENT:     Head: Normocephalic and atraumatic.     Nose: Nose normal.  Eyes:     Conjunctiva/sclera: Conjunctivae normal.     Pupils: Pupils are equal, round, and reactive to light.  Cardiovascular:     Rate and Rhythm: Normal rate and regular rhythm.     Heart sounds: Normal heart sounds. No murmur heard.   No friction rub. No gallop.  Pulmonary:     Effort: Pulmonary effort is normal.     Breath sounds: Normal breath sounds.  Musculoskeletal:        General: Normal range of motion.     Cervical back: Normal range of motion.  Skin:    General: Skin is warm and dry.     Capillary Refill: Capillary refill takes less than 2 seconds.  Neurological:     General: No focal deficit present.     Mental Status: She is alert. Mental status is at baseline.  Psychiatric:        Mood and Affect: Mood is elated.        Speech: Speech is rapid and pressured.        Behavior: Behavior is hyperactive.        Thought Content: Thought content normal.        Judgment: Judgment normal.    Results for orders  placed or performed in visit on 07/28/21  Thyroid Panel With TSH  Result Value Ref Range   TSH 7.910 (H) 0.450 - 4.500 uIU/mL   T4, Total 7.7 4.5 - 12.0 ug/dL   T3 Uptake Ratio 29 24 - 39 %   Free Thyroxine Index 2.2 1.2 - 4.9  CBC with Differential/Platelet  Result Value Ref Range   WBC 5.0 3.4 - 10.8 x10E3/uL   RBC 4.72 3.77 - 5.28 x10E6/uL   Hemoglobin 12.3 11.1 - 15.9 g/dL   Hematocrit 14/12/22 86.1 - 46.6 %  MCV 81 79 - 97 fL   MCH 26.1 (L) 26.6 - 33.0 pg   MCHC 32.1 31.5 - 35.7 g/dL   RDW 88.8 (H) 35.8 - 44.6 %   Platelets 307 150 - 450 x10E3/uL   Neutrophils 51 Not Estab. %   Lymphs 34 Not Estab. %   Monocytes 8 Not Estab. %   Eos 6 Not Estab. %   Basos 1 Not Estab. %   Neutrophils Absolute 2.5 1.4 - 7.0 x10E3/uL   Lymphocytes Absolute 1.7 0.7 - 3.1 x10E3/uL   Monocytes Absolute 0.4 0.1 - 0.9 x10E3/uL   EOS (ABSOLUTE) 0.3 0.0 - 0.4 x10E3/uL   Basophils Absolute 0.1 0.0 - 0.2 x10E3/uL   Immature Granulocytes 0 Not Estab. %   Immature Grans (Abs) 0.0 0.0 - 0.1 x10E3/uL  CMP14+EGFR  Result Value Ref Range   Glucose 109 (H) 70 - 99 mg/dL   BUN 10 6 - 24 mg/dL   Creatinine, Ser 5.20 0.57 - 1.00 mg/dL   eGFR 761 >91 JX/KAJ/1.42   BUN/Creatinine Ratio 15 9 - 23   Sodium 139 134 - 144 mmol/L   Potassium 4.3 3.5 - 5.2 mmol/L   Chloride 103 96 - 106 mmol/L   CO2 25 20 - 29 mmol/L   Calcium 9.0 8.7 - 10.2 mg/dL   Total Protein 6.5 6.0 - 8.5 g/dL   Albumin 4.5 3.8 - 4.8 g/dL   Globulin, Total 2.0 1.5 - 4.5 g/dL   Albumin/Globulin Ratio 2.3 (H) 1.2 - 2.2   Bilirubin Total 0.5 0.0 - 1.2 mg/dL   Alkaline Phosphatase 100 44 - 121 IU/L   AST 13 0 - 40 IU/L   ALT 12 0 - 32 IU/L  Vitamin B12  Result Value Ref Range   Vitamin B-12 612 232 - 1,245 pg/mL  VITAMIN D 25 Hydroxy (Vit-D Deficiency, Fractures)  Result Value Ref Range   Vit D, 25-Hydroxy 10.7 (L) 30.0 - 100.0 ng/mL       Pertinent labs & imaging results that were available during my care of the patient were  reviewed by me and considered in my medical decision making.  Assessment & Plan:  Mildreth was seen today for medication management.  Diagnoses and all orders for this visit:  Acquired hypothyroidism -Will recheck TSH/T3/T4 today. Ambulatory referral sent to endocrinology. Patient to follow up with complete physical. Return to work note given today. Will send in brand Synthroid as pt continues to not tolerate generic levothyroxine.   Continue all other maintenance medications.  Follow up plan: Follow up in 3 months with PCP for complete physical.   Continue healthy lifestyle choices, including diet (rich in fruits, vegetables, and lean proteins, and low in salt and simple carbohydrates) and exercise (at least 30 minutes of moderate physical activity daily).  Educational handout given for hypothyroidism.  The above assessment and management plan was discussed with the patient. The patient verbalized understanding of and has agreed to the management plan. Patient is aware to call the clinic if they develop any new symptoms or if symptoms persist or worsen. Patient is aware when to return to the clinic for a follow-up visit. Patient educated on when it is appropriate to go to the emergency department.   Bard Herbert, NP-S [facility] [phone]  I personally was present during the history, physical exam, and medical decision-making activities of this visit and have verified that the services and findings are accurately documented in the nurse practitioner student's note.  Monia Pouch, FNP-C [facility] [address] [place],  [address] [phone]

## 2021-09-11 ENCOUNTER — Encounter: Payer: Self-pay | Admitting: *Deleted

## 2021-09-11 LAB — THYROID PANEL WITH TSH
Free Thyroxine Index: 1.4 (ref 1.2–4.9)
T3 Uptake Ratio: 24 % (ref 24–39)
T4, Total: 5.9 ug/dL (ref 4.5–12.0)
TSH: 7.29 u[IU]/mL — ABNORMAL HIGH (ref 0.450–4.500)

## 2021-09-11 MED ORDER — SYNTHROID 112 MCG PO TABS: 112.0000 ug | ORAL_TABLET | Freq: Every day | ORAL | 4 refills | Status: DC

## 2021-09-11 NOTE — Addendum Note (Signed)
Addended by: Baruch Gouty on: 09/11/2021 07:58 AM   Modules accepted: Orders

## 2021-09-29 ENCOUNTER — Encounter: Payer: Self-pay | Admitting: Family Medicine

## 2021-09-30 ENCOUNTER — Other Ambulatory Visit: Payer: Self-pay | Admitting: Family Medicine

## 2021-09-30 DIAGNOSIS — E039 Hypothyroidism, unspecified: Secondary | ICD-10-CM

## 2021-09-30 MED ORDER — SYNTHROID 112 MCG PO TABS: 112.0000 ug | ORAL_TABLET | Freq: Every day | ORAL | 4 refills | Status: DC

## 2021-10-29 ENCOUNTER — Other Ambulatory Visit: Payer: Self-pay | Admitting: Family Medicine

## 2021-10-29 DIAGNOSIS — E559 Vitamin D deficiency, unspecified: Secondary | ICD-10-CM

## 2021-11-04 ENCOUNTER — Ambulatory Visit (INDEPENDENT_AMBULATORY_CARE_PROVIDER_SITE_OTHER): Payer: No Typology Code available for payment source | Admitting: Nurse Practitioner

## 2021-11-04 ENCOUNTER — Encounter: Payer: Self-pay | Admitting: Nurse Practitioner

## 2021-11-04 VITALS — BP 122/87 | HR 57 | Temp 98.8°F | Ht 62.0 in | Wt 211.0 lb

## 2021-11-04 DIAGNOSIS — L720 Epidermal cyst: Secondary | ICD-10-CM

## 2021-11-04 NOTE — Progress Notes (Signed)
? ?Acute Office Visit ? ?Subjective:  ? ? Patient ID: Donna Lynch, female    DOB: February 02, 1976, 46 y.o.   MRN: [id number] ? ?Chief Complaint  ?Patient presents with  ? Cyst  ? ? ?HPI ?Patient is in today for patient is a 46 year old female who presents to clinic for epidermal cyst on left lateral hand.  Hard cyst present for a few weeks.  Symptoms of tenderness and pain have worsened in the past few days. Patient requesting dermatology referral ? ? ?Past Medical History:  ?Diagnosis Date  ? Anemia   ? Hypothyroid 2020  ? Hypothyroidism 07/29/2020  ? Thyroid disease   ? ? ?Past Surgical History:  ?Procedure Laterality Date  ? CHOLECYSTECTOMY    ? NECK SURGERY    ? ? ?Family History  ?Problem Relation Age of Onset  ? Healthy Mother   ? Healthy Father   ? Thyroid disease Maternal Grandmother   ? Cancer Paternal Grandmother   ?     abdominal  ? ? ?Social History  ? ?Socioeconomic History  ? Marital status: Legally Separated  ?  Spouse name: Not on file  ? Number of children: Not on file  ? Years of education: Not on file  ? Highest education level: Not on file  ?Occupational History  ? Not on file  ?Tobacco Use  ? Smoking status: Former  ?  Types: Cigarettes  ?  Quit date: 08/17/2010  ?  Years since quitting: 11.2  ? Smokeless tobacco: Never  ?Vaping Use  ? Vaping Use: Never used  ?Substance and Sexual Activity  ? Alcohol use: Yes  ?  Comment: socially  ? Drug use: Never  ? Sexual activity: Not on file  ?Other Topics Concern  ? Not on file  ?Social History Narrative  ? Not on file  ? ?Social Determinants of Health  ? ?Financial Resource Strain: Not on file  ?Food Insecurity: Not on file  ?Transportation Needs: Not on file  ?Physical Activity: Not on file  ?Stress: Not on file  ?Social Connections: Not on file  ?Intimate Partner Violence: Not on file  ? ? ?Outpatient Medications Prior to Visit  ?Medication Sig Dispense Refill  ? SYNTHROID 112 MCG tablet Take 1 tablet (112 mcg total) by mouth daily before breakfast. 30  tablet 4  ? phentermine 37.5 MG capsule Take 37.5 mg by mouth every morning. (Patient not taking: Reported on 11/04/2021)    ? ?No facility-administered medications prior to visit.  ? ? ?Allergies  ?Allergen Reactions  ? Other Shortness Of Breath  ?  Avoids ghost peppers  ? Penicillins   ? ? ?Review of Systems ? ?   ?Objective:  ?  ?Physical Exam ?Vitals and nursing note reviewed.  ?Constitutional:   ?   Appearance: She is obese.  ?HENT:  ?   Right Ear: External ear normal.  ?   Left Ear: External ear normal.  ?   Nose: Nose normal.  ?   Mouth/Throat:  ?   Pharynx: Oropharynx is clear.  ?Eyes:  ?   Conjunctiva/sclera: Conjunctivae normal.  ?Cardiovascular:  ?   Pulses: Normal pulses.  ?   Heart sounds: Normal heart sounds.  ?Pulmonary:  ?   Effort: Pulmonary effort is normal.  ?   Breath sounds: Normal breath sounds.  ?Abdominal:  ?   General: Bowel sounds are normal.  ?Musculoskeletal:     ?   General: Normal range of motion.  ?Skin: ?   General: Skin  is warm.  ?   Findings: No rash.  ? ?    ?   Comments: Epidermal cyst left lateral hand  ?Neurological:  ?   General: No focal deficit present.  ?   Mental Status: She is alert and oriented to person, place, and time.  ?Psychiatric:     ?   Mood and Affect: Mood normal.     ?   Behavior: Behavior normal.  ? ? ?BP 122/87   Pulse (!) 57   Temp 98.8 ?F (37.1 ?C)   Ht $R'5\' 2"'sG$  (1.575 m)   Wt 211 lb (95.7 kg)   LMP 09/24/2021 (Approximate)   SpO2 98%   BMI 38.59 kg/m?  ?Wt Readings from Last 3 Encounters:  ?11/04/21 211 lb (95.7 kg)  ?09/10/21 214 lb (97.1 kg)  ?07/28/21 202 lb (91.6 kg)  ? ? ?There are no preventive care reminders to display for this patient. ? ?There are no preventive care reminders to display for this patient. ? ? ?Lab Results  ?Component Value Date  ? TSH 7.290 (H) 09/10/2021  ? ?Lab Results  ?Component Value Date  ? WBC 5.0 07/28/2021  ? HGB 12.3 07/28/2021  ? HCT 38.3 07/28/2021  ? MCV 81 07/28/2021  ? PLT 307 07/28/2021  ? ?Lab Results   ?Component Value Date  ? NA 139 07/28/2021  ? K 4.3 07/28/2021  ? CO2 25 07/28/2021  ? GLUCOSE 109 (H) 07/28/2021  ? BUN 10 07/28/2021  ? CREATININE 0.66 07/28/2021  ? BILITOT 0.5 07/28/2021  ? ALKPHOS 100 07/28/2021  ? AST 13 07/28/2021  ? ALT 12 07/28/2021  ? PROT 6.5 07/28/2021  ? ALBUMIN 4.5 07/28/2021  ? CALCIUM 9.0 07/28/2021  ? EGFR 109 07/28/2021  ? ?Lab Results  ?Component Value Date  ? CHOL 209 (H) 06/28/2020  ? ?Lab Results  ?Component Value Date  ? HDL 52 06/28/2020  ? ?Lab Results  ?Component Value Date  ? LDLCALC 127 (H) 06/28/2020  ? ?Lab Results  ?Component Value Date  ? TRIG 170 (H) 06/28/2020  ? ?Lab Results  ?Component Value Date  ? CHOLHDL 4.0 06/28/2020  ? ?No results found for: HGBA1C ? ?   ?Assessment & Plan:  ? ?Small palpable nodule palpated on left lateral hand.  Provided education to patient that epidermal cyst is quite common and not a source of concern.  Warm compress, mild massage, Tylenol or ibuprofen for pain to manage symptoms.  Patient requesting dermatology referral.  Referral request completed.  Patient knows to follow-up if symptoms are worse or unresolved. ? ?Problem List Items Addressed This Visit   ?None ?Visit Diagnoses   ? ? Epidermal cyst    -  Primary  ? Relevant Orders  ? Ambulatory referral to Dermatology  ? ?  ? ? ? ?No orders of the defined types were placed in this encounter. ? ? ? ?Ivy Lynn, NP ? ?

## 2021-12-10 ENCOUNTER — Encounter: Payer: Self-pay | Admitting: Family Medicine

## 2021-12-10 ENCOUNTER — Ambulatory Visit (INDEPENDENT_AMBULATORY_CARE_PROVIDER_SITE_OTHER): Payer: No Typology Code available for payment source | Admitting: Family Medicine

## 2021-12-10 VITALS — BP 133/88 | HR 64 | Temp 97.9°F | Ht 62.0 in | Wt 202.4 lb

## 2021-12-10 DIAGNOSIS — E6609 Other obesity due to excess calories: Secondary | ICD-10-CM

## 2021-12-10 DIAGNOSIS — E039 Hypothyroidism, unspecified: Secondary | ICD-10-CM

## 2021-12-10 DIAGNOSIS — Z124 Encounter for screening for malignant neoplasm of cervix: Secondary | ICD-10-CM

## 2021-12-10 DIAGNOSIS — Z0001 Encounter for general adult medical examination with abnormal findings: Secondary | ICD-10-CM | POA: Diagnosis not present

## 2021-12-10 DIAGNOSIS — Z0000 Encounter for general adult medical examination without abnormal findings: Secondary | ICD-10-CM

## 2021-12-10 DIAGNOSIS — H538 Other visual disturbances: Secondary | ICD-10-CM

## 2021-12-10 DIAGNOSIS — Z6837 Body mass index (BMI) 37.0-37.9, adult: Secondary | ICD-10-CM

## 2021-12-10 NOTE — Patient Instructions (Signed)

## 2021-12-10 NOTE — Progress Notes (Signed)
? ?Donna Lynch is a 45 y.o. female presents to office today for annual physical exam examination.   ? ?Concerns today include: ?1. Abnormal pap. She established with GYN and had a colposcopy with biopsy done. She is not sure of the results of this. This was done with [facility]. ? ?She reports that her vision is terrible. It is blurry. This comes and goes. Her last eye exam was 5-6 years ago and she would told that she needed reading glasses.  ? ?She has been referred to dermatology for a cyst on her arm.  ? ?She has not been to endocrinology. She reports that she never received a call for this. Referral has now been closed due to inability to get in contact with patient.  ? ?Occupation: sterile processing, Marital status: separated, Substance use: denies ? ?Last pap smear: 2022, due for repeat 05/2021 ? ?Past Medical History:  ?Diagnosis Date  ? Anemia   ? Hypothyroid 2020  ? Hypothyroidism 07/29/2020  ? Thyroid disease   ? ?Social History  ? ?Socioeconomic History  ? Marital status: Legally Separated  ?  Spouse name: Not on file  ? Number of children: Not on file  ? Years of education: Not on file  ? Highest education level: Not on file  ?Occupational History  ? Not on file  ?Tobacco Use  ? Smoking status: Former  ?  Types: Cigarettes  ?  Quit date: 08/17/2010  ?  Years since quitting: 11.3  ? Smokeless tobacco: Never  ?Vaping Use  ? Vaping Use: Never used  ?Substance and Sexual Activity  ? Alcohol use: Yes  ?  Comment: socially  ? Drug use: Never  ? Sexual activity: Not on file  ?Other Topics Concern  ? Not on file  ?Social History Narrative  ? Not on file  ? ?Social Determinants of Health  ? ?Financial Resource Strain: Not on file  ?Food Insecurity: Not on file  ?Transportation Needs: Not on file  ?Physical Activity: Not on file  ?Stress: Not on file  ?Social Connections: Not on file  ?Intimate Partner Violence: Not on file  ? ?Past Surgical History:  ?Procedure Laterality Date  ? CHOLECYSTECTOMY    ? NECK SURGERY     ? ?Family History  ?Problem Relation Age of Onset  ? Healthy Mother   ? Healthy Father   ? Thyroid disease Maternal Grandmother   ? Cancer Paternal Grandmother   ?     abdominal  ? ? ?Current Outpatient Medications:  ?  SYNTHROID 112 MCG tablet, Take 1 tablet (112 mcg total) by mouth daily before breakfast., Disp: 30 tablet, Rfl: 4 ?  phentermine 37.5 MG capsule, Take 37.5 mg by mouth every morning. (Patient not taking: Reported on 11/04/2021), Disp: , Rfl:  ? ?Allergies  ?Allergen Reactions  ? Other Shortness Of Breath  ?  Avoids ghost peppers  ? Penicillins   ?  ? ?ROS: ?Review of Systems ?Pertinent items noted in HPI and remainder of comprehensive ROS otherwise negative.   ? ?Physical exam ?BP 133/88   Pulse 64   Temp 97.9 ?F (36.6 ?C) (Temporal)   Ht _0  (1.575 m)   Wt 202 lb 6 oz (91.8 kg)   BMI 37.01 kg/m?  ?General appearance: alert and no distress ?Head: Normocephalic, without obvious abnormality, atraumatic ?Eyes: conjunctivae/corneas clear. PERRL, EOM's intact.  ?Ears: normal TM's and external ear canals both ears ?Nose: Nares normal. Septum midline. Mucosa normal. No drainage or sinus tenderness. ?Throat: lips,  mucosa, and tongue normal; teeth and gums normal ?Neck: no adenopathy, no carotid bruit, no JVD, supple, symmetrical, trachea midline, and thyroid not enlarged, symmetric, no tenderness/mass/nodules ?Back: symmetric, no curvature. ROM normal. No CVA tenderness. ?Lungs: clear to auscultation bilaterally ?Heart: regular rate and rhythm, S1, S2 normal, no murmur, click, rub or gallop ?Abdomen: soft, non-tender; bowel sounds normal; no masses,  no organomegaly ?Extremities: extremities normal, atraumatic, no cyanosis or edema ?Skin: Skin color, texture, turgor normal. Small epidermal cyst to left forearm.  ?Neurologic: Alert and oriented X 3, normal strength and tone. Normal symmetric reflexes. Normal coordination and gait  ? ? ?Assessment/ Plan: ?Donna Lynch here for annual physical exam.   ? ?No problem-specific Assessment & Plan notes found for this encounter. ? ?Donna Lynch was seen today for annual exam. ? ?Diagnoses and all orders for this visit: ? ?Routine general medical examination at a health care facility ?Fasting labs pending.  ?-     CBC with Differential/Platelet ?-     CMP14+EGFR ?-     Lipid panel ?-     Thyroid Panel With TSH ? ?Obesity ?Per PDMP review patient was prescribed phentermine BID at weight loss clinic. Patient reports she is not taking this. Will check labs today. Handout given with diet and exercise info.  ? ?Acquired hypothyroidism ?New referral placed. Will check TSH today, patient reports that she is taking levothyroxine as prescribed. Verified correct phone number on chart. Instructed to call our office in 1 week to check on referral if she has not received a call regarding this.  ?-     Ambulatory referral to Endocrinology ? ?Cervical cancer screening ?Instructed patient to call GYN regarding colposcopy and biopsy results and to schedule follow up for repeat pap.  ? ?Blurry vision ?Instructed to have an eye exam done.  ? ? ?Counseled on healthy lifestyle choices, including diet (rich in fruits, vegetables and lean meats and low in salt and simple carbohydrates) and exercise (at least 30 minutes of moderate physical activity daily). ? ?Patient to follow up in 1 year for annual exam or sooner if needed. ? ?The above assessment and management plan was discussed with the patient. The patient verbalized understanding of and has agreed to the management plan. Patient is aware to call the clinic if symptoms persist or worsen. Patient is aware when to return to the clinic for a follow-up visit. Patient educated on when it is appropriate to go to the emergency department.  ? ?Donna Smolder, FNP-C ?[facility] ?[address] ?[place], [place] [address] ?[phone] ? ? ? ? ? ? ?

## 2021-12-11 ENCOUNTER — Other Ambulatory Visit: Payer: Self-pay | Admitting: Family Medicine

## 2021-12-11 DIAGNOSIS — E039 Hypothyroidism, unspecified: Secondary | ICD-10-CM

## 2021-12-11 LAB — CBC WITH DIFFERENTIAL/PLATELET
Basophils Absolute: 0.1 10*3/uL (ref 0.0–0.2)
Basos: 1 %
EOS (ABSOLUTE): 0.2 10*3/uL (ref 0.0–0.4)
Eos: 3 %
Hematocrit: 40.4 % (ref 34.0–46.6)
Hemoglobin: 12.2 g/dL (ref 11.1–15.9)
Immature Grans (Abs): 0 10*3/uL (ref 0.0–0.1)
Immature Granulocytes: 0 %
Lymphocytes Absolute: 1.8 10*3/uL (ref 0.7–3.1)
Lymphs: 39 %
MCH: 24.9 pg — ABNORMAL LOW (ref 26.6–33.0)
MCHC: 30.2 g/dL — ABNORMAL LOW (ref 31.5–35.7)
MCV: 83 fL (ref 79–97)
Monocytes Absolute: 0.4 10*3/uL (ref 0.1–0.9)
Monocytes: 8 %
Neutrophils Absolute: 2.3 10*3/uL (ref 1.4–7.0)
Neutrophils: 49 %
Platelets: 342 10*3/uL (ref 150–450)
RBC: 4.89 x10E6/uL (ref 3.77–5.28)
RDW: 14.8 % (ref 11.7–15.4)
WBC: 4.7 10*3/uL (ref 3.4–10.8)

## 2021-12-11 LAB — CMP14+EGFR
ALT: 7 IU/L (ref 0–32)
AST: 13 IU/L (ref 0–40)
Albumin/Globulin Ratio: 2 (ref 1.2–2.2)
Albumin: 4.4 g/dL (ref 3.8–4.8)
Alkaline Phosphatase: 88 IU/L (ref 44–121)
BUN/Creatinine Ratio: 21 (ref 9–23)
BUN: 14 mg/dL (ref 6–24)
Bilirubin Total: 0.4 mg/dL (ref 0.0–1.2)
CO2: 23 mmol/L (ref 20–29)
Calcium: 9.3 mg/dL (ref 8.7–10.2)
Chloride: 102 mmol/L (ref 96–106)
Creatinine, Ser: 0.68 mg/dL (ref 0.57–1.00)
Globulin, Total: 2.2 g/dL (ref 1.5–4.5)
Glucose: 110 mg/dL — ABNORMAL HIGH (ref 70–99)
Potassium: 4.5 mmol/L (ref 3.5–5.2)
Sodium: 138 mmol/L (ref 134–144)
Total Protein: 6.6 g/dL (ref 6.0–8.5)
eGFR: 107 mL/min/{1.73_m2} (ref >59)

## 2021-12-11 LAB — LIPID PANEL
Chol/HDL Ratio: 3.9 ratio (ref 0.0–4.4)
Cholesterol, Total: 197 mg/dL (ref 100–199)
HDL: 51 mg/dL (ref >39)
LDL Chol Calc (NIH): 127 mg/dL — ABNORMAL HIGH (ref 0–99)
Triglycerides: 104 mg/dL (ref 0–149)
VLDL Cholesterol Cal: 19 mg/dL (ref 5–40)

## 2021-12-11 LAB — THYROID PANEL WITH TSH
Free Thyroxine Index: 1.8 (ref 1.2–4.9)
T3 Uptake Ratio: 29 % (ref 24–39)
T4, Total: 6.1 ug/dL (ref 4.5–12.0)
TSH: 4.68 u[IU]/mL — ABNORMAL HIGH (ref 0.450–4.500)

## 2021-12-11 MED ORDER — LEVOTHYROXINE SODIUM 125 MCG PO TABS
125.0000 ug | ORAL_TABLET | Freq: Every day | ORAL | 0 refills | Status: DC
Start: 2021-12-11 — End: 2022-01-22

## 2021-12-12 ENCOUNTER — Telehealth: Payer: Self-pay | Admitting: "Endocrinology

## 2021-12-12 ENCOUNTER — Encounter: Payer: Self-pay | Admitting: Emergency Medicine

## 2021-12-12 NOTE — Telephone Encounter (Signed)
Received referral from [facility] for patient, contacted her to see if she wanted to set up an appointment here as she was a new patient in 2021. Patient said she will not be seen here and hung up. Closing referral.  ?

## 2022-01-01 ENCOUNTER — Telehealth: Payer: Self-pay | Admitting: Family Medicine

## 2022-01-01 NOTE — Telephone Encounter (Signed)
That is fine with me.

## 2022-01-01 NOTE — Telephone Encounter (Signed)
Patient would like to change PCP to [facility].  Will this be okay for her to do?

## 2022-01-15 ENCOUNTER — Other Ambulatory Visit: Payer: Self-pay | Admitting: *Deleted

## 2022-01-15 ENCOUNTER — Encounter: Payer: Self-pay | Admitting: General Surgery

## 2022-01-15 ENCOUNTER — Ambulatory Visit: Payer: No Typology Code available for payment source | Admitting: General Surgery

## 2022-01-15 VITALS — BP 117/86 | HR 76 | Temp 98.2°F | Resp 16 | Ht 61.0 in | Wt 197.0 lb

## 2022-01-15 DIAGNOSIS — D367 Benign neoplasm of other specified sites: Secondary | ICD-10-CM

## 2022-01-15 DIAGNOSIS — D1724 Benign lipomatous neoplasm of skin and subcutaneous tissue of left leg: Secondary | ICD-10-CM

## 2022-01-15 NOTE — Progress Notes (Signed)
[facility] History and Physical  Reason for Referral:*** Referring Physician: ***  Chief Complaint   New Patient (Initial Visit)     Donna Lynch is a 46 y.o. female.  HPI: ***.  The *** started *** and has had a duration of ***.  It is associated with ***.  The *** is improved with ***, and is made worse with ***.    Quality*** Context***  Past Medical History:  Diagnosis Date  . Anemia   . Hypothyroid 2020  . Hypothyroidism 07/29/2020  . Thyroid disease     Past Surgical History:  Procedure Laterality Date  . CHOLECYSTECTOMY    . NECK SURGERY      Family History  Problem Relation Age of Onset  . Healthy Mother   . Healthy Father   . Thyroid disease Maternal Grandmother   . Cancer Paternal Grandmother        abdominal    Social History   Tobacco Use  . Smoking status: Former    Types: Cigarettes    Quit date: 08/17/2010    Years since quitting: 11.4  . Smokeless tobacco: Never  Vaping Use  . Vaping Use: Never used  Substance Use Topics  . Alcohol use: Yes    Comment: socially  . Drug use: Never    Medications: {medication reviewed/display:3041432} Allergies as of 01/15/2022       Reactions   Other Shortness Of Breath   Avoids ghost peppers   Penicillins         Medication List        Accurate as of January 15, 2022  2:18 PM. If you have any questions, ask your nurse or doctor.          levothyroxine 125 MCG tablet Commonly known as: Synthroid Take 1 tablet (125 mcg total) by mouth daily before breakfast. Brand only. Unable to tolerate generic   phentermine 37.5 MG capsule Take 37.5 mg by mouth every morning.         ROS:  {Review of Systems:30496}  Blood pressure 117/86, pulse 76, temperature 98.2 F (36.8 C), temperature source Oral, resp. rate 16, height '5\' 1"'$  (1.549 m), weight 197 lb (89.4 kg), SpO2 97 %. Physical Exam  Results: No results found for this or any previous visit (from the past 48  hour(s)).  No results found.   Assessment & Plan:  Donna Lynch is a 46 y.o. female with *** -*** -*** -Follow up ***  Future Appointments  Date Time Provider [place]  01/21/2022  2:35 PM Baruch Gouty, FNP [facility] None  02/24/2022 10:30 AM Virl Cagey, MD [facility] None    All questions were answered to the satisfaction of the patient and family***.  The risk and benefits of *** were discussed including but not limited to ***.  After careful consideration, Donna Lynch has decided to ***.    Virl Cagey 01/15/2022, 2:18 PM

## 2022-01-15 NOTE — Patient Instructions (Signed)
Take tylenol 1000 mg 30 minutes before your appointment.   Epidermoid Cyst Removal Epidermoid cyst removal is a procedure to remove a fluid-filled sac that forms under your skin (epidermoid cyst). This type of cyst is filled with a thick, oily substance (keratin) that is secreted by your skin glands. Epidermoid cysts may also be called epidermal cysts, or keratin cysts. Normally, the skin secretes this pasty material through a gland or a hair follicle. However, when a skin gland or hair follicle becomes blocked, an epidermoid cyst can form. You may need this procedure if you have an epidermal cyst that becomes large, uncomfortable, or inflamed. Tell a health care provider about: Any allergies you have. All medicines you are taking, including vitamins, herbs, eye drops, creams, and over-the-counter medicines. Any problems you or family members have had with anesthetic medicines. Any blood disorders you have. Any surgeries you have had. Any medical conditions you have now or have had. Whether you are pregnant or may be pregnant. What are the risks? Generally, this is a safe procedure. However, problems may occur, including: Recurrence of the cyst. Bleeding. Infection. Scarring. What happens before the procedure? Ask your health care provider about: Changing or stopping your regular medicines. This is especially important if you are taking diabetes medicines or blood thinners. Taking medicines such as aspirin and ibuprofen. These medicines can thin your blood. Do not take these medicines unless your health care provider tells you to take them. Taking over-the-counter medicines, vitamins, herbs, and supplements. If you have an inflamed or infected cyst, you may have to take antibiotic medicine before the cyst removal. Take your antibiotic as told by your health care provider. Do not stop taking the antibiotic even if you start to feel better. Take a shower on the morning of your procedure. Your  health care provider may ask you to use a germ-killing soap. What happens during the procedure?  You will be given a medicine to numb the area (local anesthetic). The skin around the cyst will be cleaned with a germ-killing solution. The health care provider will make a small incision in your skin over the cyst. The health care provider will separate the cyst from the surrounding tissues that are under your skin. If possible, the cyst will be removed undamaged (intact). If the cyst bursts (ruptures), it will be removed in pieces. After the cyst is removed, the health care provider will control any bleeding and close the incision with small stitches (sutures). Small incisions may not need sutures, and the bleeding will be controlled by applying direct pressure with gauze. The health care provider may apply antibiotic ointment and a bandage (dressing) over the incision. The procedure may vary among health care providers and hospitals. What happens after the procedure? If you are prescribed an antibiotic medicine or ointment, take or apply it as told by your health care provider. Do not stop using the antibiotic even if you start to feel better. Summary Epidermoid cyst removal is a procedure to remove a sac that has formed under your skin. You may need this procedure if you have an epidermoid cyst that becomes large, uncomfortable, or inflamed. The health care provider will make a small incision in your skin to remove the cyst. If you are prescribed an antibiotic medicine before the procedure, after the procedure, or both, use the antibiotic as told by your health care provider. Do not stop using the antibiotic even if you start to feel better. This information is not intended to replace  advice given to you by your health care provider. Make sure you discuss any questions you have with your health care provider. Document Revised: 11/08/2019 Document Reviewed: 11/08/2019 Elsevier Patient Education   [place].  Lipoma Removal  Lipoma removal is a surgical procedure to remove a lipoma, which is a noncancerous (benign) tumor that is made up of fat cells. Most lipomas are small and painless and do not require treatment. They can form in many areas of the body but are most common under the skin of the back, arms, shoulders, buttocks, and thighs. You may need lipoma removal if you have a lipoma that is large, growing, or causing discomfort. Lipoma removal may also be done for cosmetic reasons. Tell a health care provider about: Any allergies you have. All medicines you are taking, including vitamins, herbs, eye drops, creams, and over-the-counter medicines. Any problems you or family members have had with anesthetic medicines. Any bleeding problems you have. Any surgeries you have had. Any medical conditions you have. Whether you are pregnant or may be pregnant. What are the risks? Generally, this is a safe procedure. However, problems may occur, including: Infection. Bleeding. Scarring. Allergic reactions to medicines. Damage to nearby structures or organs, such as damage to nerves or blood vessels near the lipoma. What happens before the procedure? Medicines Ask your health care provider about: Changing or stopping your regular medicines. This is especially important if you are taking diabetes medicines or blood thinners. Taking medicines such as aspirin and ibuprofen. These medicines can thin your blood. Do not take these medicines unless your health care provider tells you to take them. Taking over-the-counter medicines, vitamins, herbs, and supplements. General instructions You will have a physical exam. Your health care provider will check the size of the lipoma and whether it can be removed easily. You may have a biopsy and imaging tests, such as X-rays, a CT scan, and an MRI. Do not use any products that contain nicotine or tobacco for at least 4 weeks before the  procedure. These products include cigarettes, chewing tobacco, and vaping devices, such as e-cigarettes. If you need help quitting, ask your health care provider. Ask your health care provider: What steps will be taken to help prevent infection. These may include: Washing skin with a germ-killing soap. Taking antibiotic medicine. If you will be going home right after the procedure, plan to have a responsible adult: Take you home from the hospital or clinic. You will not be allowed to drive. Care for you for the time you are told. What happens during the procedure?  An IV will be inserted into one of your veins. You will be given one or more of the following: A medicine to help you relax (sedative). A medicine to numb the area (local anesthetic). A medicine to make you fall asleep (general anesthetic). A medicine that is injected into an area of your body to numb everything below the injection site (regional anesthetic). An incision will be made into the skin over the lipoma or very near the lipoma. The incision may be made in a natural skin line or crease. Tissues, nerves, and blood vessels near the lipoma will be moved out of the way. The lipoma and the capsule that surrounds it will be separated from the surrounding tissues. The lipoma will be removed. The incision may be closed with stitches (sutures). A bandage (dressing) will be placed over the incision. The procedure may vary among health care providers and hospitals. What happens after  the procedure? Your blood pressure, heart rate, breathing rate, and blood oxygen level will be monitored until you leave the hospital or clinic. If you were prescribed an antibiotic medicine, use it as told by your health care provider. Do not stop using the antibiotic even if you start to feel better. If you were given a sedative during the procedure, it can affect you for several hours. Do not drive or operate machinery until your health care provider  says that it is safe. Where to find more information OrthoInfo: orthoinfo.aaos.org Summary Before the procedure, follow instructions from your health care provider about eating and drinking, and changing or stopping your regular medicines. This is especially important if you are taking diabetes medicines or blood thinners. After the lipoma is removed, the incision may be closed with stitches (sutures) and covered with a bandage (dressing). If you were given a sedative during the procedure, it can affect you for several hours. Do not drive or operate machinery until your health care provider says that it is safe. This information is not intended to replace advice given to you by your health care provider. Make sure you discuss any questions you have with your health care provider. Document Revised: 08/22/2021 Document Reviewed: 08/22/2021 Elsevier Patient Education  [place].

## 2022-01-21 ENCOUNTER — Other Ambulatory Visit: Payer: Self-pay | Admitting: Family Medicine

## 2022-01-21 ENCOUNTER — Encounter: Payer: Self-pay | Admitting: Family Medicine

## 2022-01-21 ENCOUNTER — Ambulatory Visit
Admission: RE | Admit: 2022-01-21 | Discharge: 2022-01-21 | Disposition: A | Discharge status: Home / Self Care | Payer: No Typology Code available for payment source | Source: Ambulatory Visit | Attending: Family Medicine | Admitting: Family Medicine

## 2022-01-21 ENCOUNTER — Ambulatory Visit (INDEPENDENT_AMBULATORY_CARE_PROVIDER_SITE_OTHER): Payer: No Typology Code available for payment source | Admitting: Family Medicine

## 2022-01-21 VITALS — BP 112/78 | HR 83 | Temp 98.5°F | Ht 61.75 in | Wt 192.0 lb

## 2022-01-21 DIAGNOSIS — R300 Dysuria: Secondary | ICD-10-CM

## 2022-01-21 DIAGNOSIS — E559 Vitamin D deficiency, unspecified: Secondary | ICD-10-CM

## 2022-01-21 DIAGNOSIS — Z1211 Encounter for screening for malignant neoplasm of colon: Secondary | ICD-10-CM

## 2022-01-21 DIAGNOSIS — E039 Hypothyroidism, unspecified: Secondary | ICD-10-CM | POA: Diagnosis not present

## 2022-01-21 DIAGNOSIS — R87612 Low grade squamous intraepithelial lesion on cytologic smear of cervix (LGSIL): Secondary | ICD-10-CM | POA: Diagnosis not present

## 2022-01-21 DIAGNOSIS — Z1212 Encounter for screening for malignant neoplasm of rectum: Secondary | ICD-10-CM

## 2022-01-21 DIAGNOSIS — B379 Candidiasis, unspecified: Secondary | ICD-10-CM

## 2022-01-21 DIAGNOSIS — Z1231 Encounter for screening mammogram for malignant neoplasm of breast: Secondary | ICD-10-CM

## 2022-01-21 DIAGNOSIS — N3000 Acute cystitis without hematuria: Secondary | ICD-10-CM

## 2022-01-21 DIAGNOSIS — Z862 Personal history of diseases of the blood and blood-forming organs and certain disorders involving the immune mechanism: Secondary | ICD-10-CM

## 2022-01-21 DIAGNOSIS — E782 Mixed hyperlipidemia: Secondary | ICD-10-CM

## 2022-01-21 LAB — URINALYSIS, ROUTINE W REFLEX MICROSCOPIC
Bilirubin, UA: NEGATIVE
Glucose, UA: NEGATIVE
Nitrite, UA: NEGATIVE
RBC, UA: NEGATIVE
Specific Gravity, UA: >1.03 — ABNORMAL HIGH (ref 1.005–1.030)
Urobilinogen, Ur: 0.2 mg/dL (ref 0.2–1.0)
pH, UA: 5.5 (ref 5.0–7.5)

## 2022-01-21 LAB — MICROSCOPIC EXAMINATION
RBC, Urine: NONE SEEN /hpf (ref 0–2)
Renal Epithel, UA: NONE SEEN /hpf

## 2022-01-21 MED ORDER — FLUCONAZOLE 150 MG PO TABS: ORAL_TABLET | ORAL | 0 refills | Status: DC

## 2022-01-21 MED ORDER — SULFAMETHOXAZOLE-TRIMETHOPRIM 800-160 MG PO TABS: 1.0000 | ORAL_TABLET | Freq: Two times a day (BID) | ORAL | 0 refills | Status: AC

## 2022-01-21 NOTE — Progress Notes (Signed)
Subjective:  Patient ID: Donna Lynch, female    DOB: September 14, 1975, 46 y.o.   MRN: [id number]  Patient Care Team: Dettinger, Fransisca Kaufmann, MD as PCP - General (Family Medicine) Eloise Harman, DO as Consulting Physician (Internal Medicine)   Chief Complaint:  Abnormal Pap Smear   HPI: Donna Lynch is a 46 y.o. female presenting on 01/21/2022 for Abnormal Pap Smear   Presents today to discuss abnormal PAP. Pt had an abnormal PAP at GYN office ([facility]) and was to return for repeat PAP and biopsy in 6 months. Pt states she has not done this because her insurance is no longer accepted at that office. Would like a new referral today. She has thyroid disease and is on Synthroid and is doing well. States she has lost weight since starting repletion therapy. No heat or cold intolerance reported. No hyper- or hypothyroid symptoms reported. She has noted Vit D deficiency per labs but is currently not on medications for this. No recent fracture or trouble walking. Her last LDL was high and she is not on anything for her cholesterol. She has a history of IDA and just recently restarted her oral iron. She does report some lower abdominal pressure with dysuria over the last few days. No hematuria, fever, chills, weakness, flank pain, confusion, or weakness. No vaginal symptoms. She has not tried anything for the symptoms.     Relevant past medical, surgical, family, and social history reviewed and updated as indicated.  Allergies and medications reviewed and updated. Data reviewed: Chart in Epic.   Past Medical History:  Diagnosis Date   Anemia    Hypothyroid 2020   Hypothyroidism 07/29/2020   Thyroid disease     Past Surgical History:  Procedure Laterality Date   CHOLECYSTECTOMY     NECK SURGERY      Social History   Socioeconomic History   Marital status: Legally Separated    Spouse name: Not on file   Number of children: Not on file   Years of education: Not on file   Highest  education level: Not on file  Occupational History   Not on file  Tobacco Use   Smoking status: Former    Types: Cigarettes    Quit date: 08/17/2010    Years since quitting: 11.4   Smokeless tobacco: Never  Vaping Use   Vaping Use: Never used  Substance and Sexual Activity   Alcohol use: Yes    Comment: socially   Drug use: Never   Sexual activity: Not on file  Other Topics Concern   Not on file  Social History Narrative   Not on file   Social Determinants of Health   Financial Resource Strain: Not on file  Food Insecurity: Not on file  Transportation Needs: Not on file  Physical Activity: Not on file  Stress: Not on file  Social Connections: Not on file  Intimate Partner Violence: Not on file    Outpatient Encounter Medications as of 01/21/2022  Medication Sig   fluconazole (DIFLUCAN) 150 MG tablet 1 po q week x 4 weeks   levothyroxine (SYNTHROID) 125 MCG tablet Take 1 tablet (125 mcg total) by mouth daily before breakfast. Brand only. Unable to tolerate generic   phentermine 37.5 MG capsule Take 37.5 mg by mouth every morning.   sulfamethoxazole-trimethoprim (BACTRIM DS) 800-160 MG tablet Take 1 tablet by mouth 2 (two) times daily for 5 days.   No facility-administered encounter medications on file as of 01/21/2022.  Allergies  Allergen Reactions   Other Shortness Of Breath    Avoids ghost peppers   Penicillins     Review of Systems  Constitutional:  Positive for fatigue. Negative for activity change, appetite change, chills, diaphoresis, fever and unexpected weight change.  Eyes:  Negative for photophobia and visual disturbance.  Respiratory:  Negative for cough and shortness of breath.   Cardiovascular:  Negative for chest pain, palpitations and leg swelling.  Gastrointestinal:  Positive for abdominal pain. Negative for anal bleeding, blood in stool, constipation, diarrhea, nausea and vomiting.  Endocrine: Negative for cold intolerance and heat intolerance.   Genitourinary:  Positive for dysuria. Negative for decreased urine volume, difficulty urinating, flank pain, frequency, urgency, vaginal bleeding and vaginal discharge.  Musculoskeletal:  Positive for arthralgias and neck pain. Negative for back pain, gait problem, joint swelling, myalgias and neck stiffness.  Neurological:  Positive for headaches. Negative for dizziness, tremors, seizures, syncope, facial asymmetry, speech difficulty, weakness, light-headedness and numbness.  Hematological:  Bruises/bleeds easily.  Psychiatric/Behavioral:  Negative for confusion.   All other systems reviewed and are negative.      Objective:  BP 112/78   Pulse 83   Temp 98.5 F (36.9 C)   Ht 5' 1.75" (1.568 m)   Wt 192 lb (87.1 kg)   LMP  (LMP Unknown) Comment: couple months ago  SpO2 99%   BMI 35.40 kg/m    Wt Readings from Last 3 Encounters:  01/21/22 192 lb (87.1 kg)  01/15/22 197 lb (89.4 kg)  12/10/21 202 lb 6 oz (91.8 kg)    Physical Exam Vitals and nursing note reviewed.  Constitutional:      General: She is not in acute distress.    Appearance: Normal appearance. She is well-developed and well-groomed. She is obese. She is not ill-appearing, toxic-appearing or diaphoretic.  HENT:     Head: Normocephalic and atraumatic.     Jaw: There is normal jaw occlusion.     Right Ear: Hearing normal.     Left Ear: Hearing normal.     Nose: Nose normal.     Mouth/Throat:     Lips: Pink.     Mouth: Mucous membranes are moist.     Pharynx: Oropharynx is clear. Uvula midline.  Eyes:     General: Lids are normal.     Extraocular Movements: Extraocular movements intact.     Conjunctiva/sclera: Conjunctivae normal.     Pupils: Pupils are equal, round, and reactive to light.  Neck:     Thyroid: No thyroid mass, thyromegaly or thyroid tenderness.     Vascular: No carotid bruit or JVD.     Trachea: Trachea and phonation normal.  Cardiovascular:     Rate and Rhythm: Normal rate and regular  rhythm.     Chest Wall: PMI is not displaced.     Pulses: Normal pulses.     Heart sounds: Normal heart sounds. No murmur heard.   No friction rub. No gallop.  Pulmonary:     Effort: Pulmonary effort is normal. No respiratory distress.     Breath sounds: Normal breath sounds. No wheezing.  Abdominal:     General: Abdomen is protuberant. Bowel sounds are normal. There is no distension or abdominal bruit. There are no signs of injury.     Palpations: Abdomen is soft. There is no hepatomegaly or splenomegaly.     Tenderness: There is abdominal tenderness in the suprapubic area. There is no right CVA tenderness, left CVA tenderness, guarding or  rebound. Negative signs include Murphy's sign, Rovsing's sign, McBurney's sign, psoas sign and obturator sign.     Hernia: No hernia is present.  Musculoskeletal:        General: Normal range of motion.     Cervical back: Normal range of motion and neck supple.     Right lower leg: No edema.     Left lower leg: No edema.  Lymphadenopathy:     Cervical: No cervical adenopathy.  Skin:    General: Skin is warm and dry.     Capillary Refill: Capillary refill takes less than 2 seconds.     Coloration: Skin is not cyanotic, jaundiced or pale.     Findings: No rash.  Neurological:     General: No focal deficit present.     Mental Status: She is alert and oriented to person, place, and time.     Sensory: Sensation is intact.     Motor: Motor function is intact.     Coordination: Coordination is intact.     Gait: Gait is intact.     Deep Tendon Reflexes: Reflexes are normal and symmetric.  Psychiatric:        Attention and Perception: Attention and perception normal.        Mood and Affect: Mood and affect normal.        Speech: Speech normal.        Behavior: Behavior normal. Behavior is cooperative.        Thought Content: Thought content normal.        Cognition and Memory: Cognition and memory normal.        Judgment: Judgment normal.     Results for orders placed or performed in visit on 12/10/21  CBC with Differential/Platelet  Result Value Ref Range   WBC 4.7 3.4 - 10.8 x10E3/uL   RBC 4.89 3.77 - 5.28 x10E6/uL   Hemoglobin 12.2 11.1 - 15.9 g/dL   Hematocrit 24.7 31.9 - 46.6 %   MCV 83 79 - 97 fL   MCH 24.9 (L) 26.6 - 33.0 pg   MCHC 30.2 (L) 31.5 - 35.7 g/dL   RDW 24.3 83.6 - 54.2 %   Platelets 342 150 - 450 x10E3/uL   Neutrophils 49 Not Estab. %   Lymphs 39 Not Estab. %   Monocytes 8 Not Estab. %   Eos 3 Not Estab. %   Basos 1 Not Estab. %   Neutrophils Absolute 2.3 1.4 - 7.0 x10E3/uL   Lymphocytes Absolute 1.8 0.7 - 3.1 x10E3/uL   Monocytes Absolute 0.4 0.1 - 0.9 x10E3/uL   EOS (ABSOLUTE) 0.2 0.0 - 0.4 x10E3/uL   Basophils Absolute 0.1 0.0 - 0.2 x10E3/uL   Immature Granulocytes 0 Not Estab. %   Immature Grans (Abs) 0.0 0.0 - 0.1 x10E3/uL  CMP14+EGFR  Result Value Ref Range   Glucose 110 (H) 70 - 99 mg/dL   BUN 14 6 - 24 mg/dL   Creatinine, Ser 7.15 0.57 - 1.00 mg/dL   eGFR 664 >83 SH/IQY/1.99   BUN/Creatinine Ratio 21 9 - 23   Sodium 138 134 - 144 mmol/L   Potassium 4.5 3.5 - 5.2 mmol/L   Chloride 102 96 - 106 mmol/L   CO2 23 20 - 29 mmol/L   Calcium 9.3 8.7 - 10.2 mg/dL   Total Protein 6.6 6.0 - 8.5 g/dL   Albumin 4.4 3.8 - 4.8 g/dL   Globulin, Total 2.2 1.5 - 4.5 g/dL   Albumin/Globulin Ratio 2.0 1.2 - 2.2  Bilirubin Total 0.4 0.0 - 1.2 mg/dL   Alkaline Phosphatase 88 44 - 121 IU/L   AST 13 0 - 40 IU/L   ALT 7 0 - 32 IU/L  Lipid panel  Result Value Ref Range   Cholesterol, Total 197 100 - 199 mg/dL   Triglycerides 104 0 - 149 mg/dL   HDL 51 >39 mg/dL   VLDL Cholesterol Cal 19 5 - 40 mg/dL   LDL Chol Calc (NIH) 127 (H) 0 - 99 mg/dL   Chol/HDL Ratio 3.9 0.0 - 4.4 ratio  Thyroid Panel With TSH  Result Value Ref Range   TSH 4.680 (H) 0.450 - 4.500 uIU/mL   T4, Total 6.1 4.5 - 12.0 ug/dL   T3 Uptake Ratio 29 24 - 39 %   Free Thyroxine Index 1.8 1.2 - 4.9     Urinalysis in office: 2+  ketones, trace protein, trace leukocytes, few bacteria, yeast present  Pertinent labs & imaging results that were available during my care of the patient were reviewed by me and considered in my medical decision making.  Assessment & Plan:  Nashalie was seen today for abnormal pap smear.  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 Her insurance does not cover previous GYN, will place new referral. Pt aware it is important to follow up for repeat PAP.  -     Ambulatory referral to Gynecology  Screening for colorectal cancer Has not had colonscopy, will place referral today. -     Ambulatory referral to Gastroenterology  Acquired hypothyroidism Has been compliant with Synthroid therapy. Will repeat labs today and adjust regimen if warranted.  -     Thyroid Panel With TSH  Vitamin D deficiency Currently not on repletion therapy, will check labs today and restart if warranted.  -     VITAMIN D 25 Hydroxy (Vit-D Deficiency, Fractures)  Mixed hyperlipidemia Will obtain labs today. Red Yeast Rice recommended. Diet and exercise encouraged. -     CMP14+EGFR -     Lipid panel  History of anemia Will check labs today.  -     Anemia Profile B  Dysuria Acute cystitis without hematuria Urinalysis as noted. Bactrim as prescribed. Culture pending, will change therapy if warranted.  -     Urine Culture -     sulfamethoxazole-trimethoprim (BACTRIM DS) 800-160 MG tablet; Take 1 tablet by mouth 2 (two) times daily for 5 days. -     Urinalysis, Routine w reflex microscopic  Candidiasis Present in urine, will treat with below.  -     fluconazole (DIFLUCAN) 150 MG tablet; 1 po q week x 4 weeks     Continue all other maintenance medications.  Follow up plan: Return in about 6 months (around 07/23/2022), or if symptoms worsen or fail to improve.   Continue healthy  lifestyle choices, including diet (rich in fruits, vegetables, and lean proteins, and low in salt and simple carbohydrates) and exercise (at least 30 minutes of moderate physical activity daily).  Educational handout given for health maintenance   The above assessment and management plan was discussed with the patient. The patient verbalized understanding of and has agreed to the management plan. Patient is aware to call the clinic if they develop any new symptoms or if symptoms persist or worsen. Patient is aware when to return to the clinic for a follow-up visit. Patient educated on when  it is appropriate to go to the emergency department.   Monia Pouch, FNP-C [facility] [phone]

## 2022-01-22 LAB — ANEMIA PROFILE B
Basophils Absolute: 0 10*3/uL (ref 0.0–0.2)
Basos: 1 %
EOS (ABSOLUTE): 0.1 10*3/uL (ref 0.0–0.4)
Eos: 2 %
Ferritin: 25 ng/mL (ref 15–150)
Folate: 8.6 ng/mL (ref >3.0)
Hematocrit: 39 % (ref 34.0–46.6)
Hemoglobin: 12.6 g/dL (ref 11.1–15.9)
Immature Grans (Abs): 0 10*3/uL (ref 0.0–0.1)
Immature Granulocytes: 0 %
Iron Saturation: 15 % (ref 15–55)
Iron: 66 ug/dL (ref 27–159)
Lymphocytes Absolute: 1.6 10*3/uL (ref 0.7–3.1)
Lymphs: 31 %
MCH: 26.5 pg — ABNORMAL LOW (ref 26.6–33.0)
MCHC: 32.3 g/dL (ref 31.5–35.7)
MCV: 82 fL (ref 79–97)
Monocytes Absolute: 0.3 10*3/uL (ref 0.1–0.9)
Monocytes: 7 %
Neutrophils Absolute: 3.1 10*3/uL (ref 1.4–7.0)
Neutrophils: 59 %
Platelets: 344 10*3/uL (ref 150–450)
RBC: 4.76 x10E6/uL (ref 3.77–5.28)
RDW: 14.8 % (ref 11.7–15.4)
Retic Ct Pct: 1.3 % (ref 0.6–2.6)
Total Iron Binding Capacity: 441 ug/dL (ref 250–450)
UIBC: 375 ug/dL (ref 131–425)
Vitamin B-12: 1044 pg/mL (ref 232–1245)
WBC: 5.2 10*3/uL (ref 3.4–10.8)

## 2022-01-22 LAB — CMP14+EGFR
ALT: 14 IU/L (ref 0–32)
AST: 18 IU/L (ref 0–40)
Albumin/Globulin Ratio: 2 (ref 1.2–2.2)
Albumin: 4.8 g/dL (ref 3.8–4.8)
Alkaline Phosphatase: 94 IU/L (ref 44–121)
BUN/Creatinine Ratio: 14 (ref 9–23)
BUN: 10 mg/dL (ref 6–24)
Bilirubin Total: 1 mg/dL (ref 0.0–1.2)
CO2: 23 mmol/L (ref 20–29)
Calcium: 9.6 mg/dL (ref 8.7–10.2)
Chloride: 98 mmol/L (ref 96–106)
Creatinine, Ser: 0.72 mg/dL (ref 0.57–1.00)
Globulin, Total: 2.4 g/dL (ref 1.5–4.5)
Glucose: 108 mg/dL — ABNORMAL HIGH (ref 70–99)
Potassium: 4.1 mmol/L (ref 3.5–5.2)
Sodium: 137 mmol/L (ref 134–144)
Total Protein: 7.2 g/dL (ref 6.0–8.5)
eGFR: 103 mL/min/{1.73_m2} (ref >59)

## 2022-01-22 LAB — THYROID PANEL WITH TSH
Free Thyroxine Index: 2.1 (ref 1.2–4.9)
T3 Uptake Ratio: 29 % (ref 24–39)
T4, Total: 7.3 ug/dL (ref 4.5–12.0)
TSH: 10.7 u[IU]/mL — ABNORMAL HIGH (ref 0.450–4.500)

## 2022-01-22 LAB — LIPID PANEL
Chol/HDL Ratio: 4.7 ratio — ABNORMAL HIGH (ref 0.0–4.4)
Cholesterol, Total: 212 mg/dL — ABNORMAL HIGH (ref 100–199)
HDL: 45 mg/dL (ref >39)
LDL Chol Calc (NIH): 148 mg/dL — ABNORMAL HIGH (ref 0–99)
Triglycerides: 106 mg/dL (ref 0–149)
VLDL Cholesterol Cal: 19 mg/dL (ref 5–40)

## 2022-01-22 LAB — VITAMIN D 25 HYDROXY (VIT D DEFICIENCY, FRACTURES): Vit D, 25-Hydroxy: 19 ng/mL — ABNORMAL LOW (ref 30.0–100.0)

## 2022-01-22 MED ORDER — VITAMIN D (ERGOCALCIFEROL) 1.25 MG (50000 UNIT) PO CAPS: 50000.0000 [IU] | ORAL_CAPSULE | ORAL | 3 refills | Status: DC

## 2022-01-22 MED ORDER — SYNTHROID 150 MCG PO TABS: 150.0000 ug | ORAL_TABLET | Freq: Every day | ORAL | 1 refills | Status: DC

## 2022-01-22 NOTE — Addendum Note (Signed)
Addended by: Baruch Gouty on: 01/22/2022 09:04 AM   Modules accepted: Orders

## 2022-01-23 ENCOUNTER — Encounter: Payer: Self-pay | Admitting: Family Medicine

## 2022-01-23 LAB — URINE CULTURE: Organism ID, Bacteria: NO GROWTH

## 2022-01-24 ENCOUNTER — Emergency Department (HOSPITAL_COMMUNITY)
Admission: EM | Admit: 2022-01-24 | Discharge: 2022-01-24 | Disposition: A | Discharge status: Home / Self Care | Payer: No Typology Code available for payment source | Attending: Emergency Medicine | Admitting: Emergency Medicine

## 2022-01-24 ENCOUNTER — Encounter (HOSPITAL_COMMUNITY): Payer: Self-pay | Admitting: Emergency Medicine

## 2022-01-24 ENCOUNTER — Other Ambulatory Visit: Payer: Self-pay

## 2022-01-24 ENCOUNTER — Emergency Department (HOSPITAL_COMMUNITY): Payer: No Typology Code available for payment source

## 2022-01-24 DIAGNOSIS — R1030 Lower abdominal pain, unspecified: Secondary | ICD-10-CM | POA: Diagnosis not present

## 2022-01-24 DIAGNOSIS — Z5321 Procedure and treatment not carried out due to patient leaving prior to being seen by health care provider: Secondary | ICD-10-CM | POA: Diagnosis not present

## 2022-01-24 LAB — URINALYSIS, ROUTINE W REFLEX MICROSCOPIC
Bacteria, UA: NONE SEEN
Bilirubin Urine: NEGATIVE
Glucose, UA: NEGATIVE mg/dL
Hgb urine dipstick: NEGATIVE
Ketones, ur: NEGATIVE mg/dL
Nitrite: NEGATIVE
Protein, ur: NEGATIVE mg/dL
Specific Gravity, Urine: 1.006 (ref 1.005–1.030)
pH: 5 (ref 5.0–8.0)

## 2022-01-24 LAB — COMPREHENSIVE METABOLIC PANEL
ALT: 17 U/L (ref 0–44)
AST: 22 U/L (ref 15–41)
Albumin: 4.6 g/dL (ref 3.5–5.0)
Alkaline Phosphatase: 78 U/L (ref 38–126)
Anion gap: 11 (ref 5–15)
BUN: 8 mg/dL (ref 6–20)
CO2: 24 mmol/L (ref 22–32)
Calcium: 9.3 mg/dL (ref 8.9–10.3)
Chloride: 100 mmol/L (ref 98–111)
Creatinine, Ser: 0.92 mg/dL (ref 0.44–1.00)
GFR, Estimated: >60 mL/min (ref >60)
Glucose, Bld: 122 mg/dL — ABNORMAL HIGH (ref 70–99)
Potassium: 3.8 mmol/L (ref 3.5–5.1)
Sodium: 135 mmol/L (ref 135–145)
Total Bilirubin: 0.8 mg/dL (ref 0.3–1.2)
Total Protein: 7.4 g/dL (ref 6.5–8.1)

## 2022-01-24 LAB — CBC
HCT: 39.2 % (ref 36.0–46.0)
Hemoglobin: 12.7 g/dL (ref 12.0–15.0)
MCH: 27 pg (ref 26.0–34.0)
MCHC: 32.4 g/dL (ref 30.0–36.0)
MCV: 83.2 fL (ref 80.0–100.0)
Platelets: 334 10*3/uL (ref 150–400)
RBC: 4.71 MIL/uL (ref 3.87–5.11)
RDW: 14.9 % (ref 11.5–15.5)
WBC: 5.8 10*3/uL (ref 4.0–10.5)
nRBC: 0 % (ref 0.0–0.2)

## 2022-01-24 LAB — I-STAT BETA HCG BLOOD, ED (MC, WL, AP ONLY): I-stat hCG, quantitative: <5 m[IU]/mL (ref <5)

## 2022-01-24 LAB — LIPASE, BLOOD: Lipase: 26 U/L (ref 11–51)

## 2022-01-24 MED ORDER — ALBUTEROL SULFATE HFA 108 (90 BASE) MCG/ACT IN AERS: 2.0000 | INHALATION_SPRAY | RESPIRATORY_TRACT | Status: DC | PRN

## 2022-01-24 NOTE — ED Triage Notes (Signed)
Pt reported to ED with c/o lower  abdominal pain and pressure that radiates into back and upwards into abdomen. Pt states she was recently diagnosed with UTI but has been feeling increased pressure in lower abdomen and back. Pt also states she feels short of breath and dizzy at times causing her to feel faint.

## 2022-01-29 ENCOUNTER — Emergency Department (HOSPITAL_COMMUNITY)
Admission: EM | Admit: 2022-01-29 | Discharge: 2022-01-29 | Disposition: A | Discharge status: Home / Self Care | Payer: No Typology Code available for payment source | Attending: Emergency Medicine | Admitting: Emergency Medicine

## 2022-01-29 ENCOUNTER — Ambulatory Visit (INDEPENDENT_AMBULATORY_CARE_PROVIDER_SITE_OTHER): Payer: No Typology Code available for payment source | Admitting: Family Medicine

## 2022-01-29 ENCOUNTER — Encounter: Payer: Self-pay | Admitting: Family Medicine

## 2022-01-29 ENCOUNTER — Other Ambulatory Visit: Payer: Self-pay

## 2022-01-29 ENCOUNTER — Encounter (HOSPITAL_COMMUNITY): Payer: Self-pay | Admitting: Emergency Medicine

## 2022-01-29 ENCOUNTER — Emergency Department (HOSPITAL_COMMUNITY): Payer: No Typology Code available for payment source

## 2022-01-29 VITALS — BP 125/87 | HR 80 | Temp 98.8°F | Ht 61.75 in | Wt 190.0 lb

## 2022-01-29 DIAGNOSIS — N1330 Unspecified hydronephrosis: Secondary | ICD-10-CM | POA: Insufficient documentation

## 2022-01-29 DIAGNOSIS — K769 Liver disease, unspecified: Secondary | ICD-10-CM | POA: Diagnosis not present

## 2022-01-29 DIAGNOSIS — R109 Unspecified abdominal pain: Secondary | ICD-10-CM

## 2022-01-29 DIAGNOSIS — R1012 Left upper quadrant pain: Secondary | ICD-10-CM

## 2022-01-29 LAB — COMPREHENSIVE METABOLIC PANEL
ALT: 16 U/L (ref 0–44)
AST: 17 U/L (ref 15–41)
Albumin: 4.3 g/dL (ref 3.5–5.0)
Alkaline Phosphatase: 78 U/L (ref 38–126)
Anion gap: 6 (ref 5–15)
BUN: 12 mg/dL (ref 6–20)
CO2: 28 mmol/L (ref 22–32)
Calcium: 9.2 mg/dL (ref 8.9–10.3)
Chloride: 102 mmol/L (ref 98–111)
Creatinine, Ser: 0.72 mg/dL (ref 0.44–1.00)
GFR, Estimated: >60 mL/min (ref >60)
Glucose, Bld: 110 mg/dL — ABNORMAL HIGH (ref 70–99)
Potassium: 3.8 mmol/L (ref 3.5–5.1)
Sodium: 136 mmol/L (ref 135–145)
Total Bilirubin: 0.7 mg/dL (ref 0.3–1.2)
Total Protein: 7.3 g/dL (ref 6.5–8.1)

## 2022-01-29 LAB — CBC
HCT: 39.1 % (ref 36.0–46.0)
Hemoglobin: 12.4 g/dL (ref 12.0–15.0)
MCH: 26.6 pg (ref 26.0–34.0)
MCHC: 31.7 g/dL (ref 30.0–36.0)
MCV: 83.7 fL (ref 80.0–100.0)
Platelets: 274 10*3/uL (ref 150–400)
RBC: 4.67 MIL/uL (ref 3.87–5.11)
RDW: 15.5 % (ref 11.5–15.5)
WBC: 3.9 10*3/uL — ABNORMAL LOW (ref 4.0–10.5)
nRBC: 0 % (ref 0.0–0.2)

## 2022-01-29 LAB — URINALYSIS, ROUTINE W REFLEX MICROSCOPIC
Bilirubin Urine: NEGATIVE
Glucose, UA: NEGATIVE mg/dL
Hgb urine dipstick: NEGATIVE
Ketones, ur: NEGATIVE mg/dL
Leukocytes,Ua: NEGATIVE
Nitrite: NEGATIVE
Protein, ur: NEGATIVE mg/dL
Specific Gravity, Urine: 1.006 (ref 1.005–1.030)
pH: 7 (ref 5.0–8.0)

## 2022-01-29 LAB — LIPASE, BLOOD: Lipase: 32 U/L (ref 11–51)

## 2022-01-29 LAB — POC URINE PREG, ED: Preg Test, Ur: NEGATIVE

## 2022-01-29 MED ORDER — IOHEXOL 300 MG/ML  SOLN
100.0000 mL | Freq: Once | INTRAMUSCULAR | Status: AC | PRN
Start: 2022-01-29 — End: 2022-01-29
  Administered 2022-01-29: 100 mL via INTRAVENOUS

## 2022-01-29 MED ORDER — DICYCLOMINE HCL 20 MG PO TABS: 20.0000 mg | ORAL_TABLET | Freq: Four times a day (QID) | ORAL | 1 refills | Status: DC

## 2022-01-29 NOTE — ED Provider Notes (Signed)
[facility]  Provider Note  CSN: [id number] Arrival date & time: 01/29/22 0215  History No chief complaint on file.   Donna Lynch is a 46 y.o. female reports several weeks of persistent cramping LUQ abdominal pain, radiates to her RUQ, suprapubic area and L flank. She has been to her PCP who checked a urine (negative culture) and is planning to refer her for colonoscopy. She went to the [facility] 5 days ago and had labs done but left prior to begin evaluated. Tonight she reports she was pushing on the area and the pain shot up into her L neck so she came to the ED for evaluation. She has not had a fever, no vomiting or diarrhea. She has some suprapubic discomfort but no dysuria or hematuria.    Home Medications Prior to Admission medications   Medication Sig Start Date End Date Taking? Authorizing Provider  fluconazole (DIFLUCAN) 150 MG tablet 1 po q week x 4 weeks 01/21/22   Baruch Gouty, FNP  phentermine 37.5 MG capsule Take 37.5 mg by mouth every morning.    [provider]  SYNTHROID 150 MCG tablet Take 1 tablet (150 mcg total) by mouth daily before breakfast. 01/22/22   Rakes, Connye Burkitt, FNP  Vitamin D, Ergocalciferol, (DRISDOL) 1.25 MG (50000 UNIT) CAPS capsule Take 1 capsule (50,000 Units total) by mouth every 7 (seven) days. 01/22/22   Baruch Gouty, FNP     Allergies    Other and Penicillins   Review of Systems   Review of Systems Please see HPI for pertinent positives and negatives  Physical Exam BP 120/82   Pulse 73   Temp 98.4 F (36.9 C) (Oral)   Resp 16   Ht 5' 1.75" (1.568 m)   Wt 86.2 kg   LMP  (LMP Unknown) Comment: 2-3 months ago  SpO2 99%   BMI 35.03 kg/m   Physical Exam Vitals and nursing note reviewed.  Constitutional:      Appearance: Normal appearance.  HENT:     Head: Normocephalic and atraumatic.     Nose: Nose normal.     Mouth/Throat:     Mouth: Mucous membranes are moist.  Eyes:     Extraocular Movements:  Extraocular movements intact.     Conjunctiva/sclera: Conjunctivae normal.  Cardiovascular:     Rate and Rhythm: Normal rate.  Pulmonary:     Effort: Pulmonary effort is normal.     Breath sounds: Normal breath sounds.  Abdominal:     General: Abdomen is flat.     Palpations: Abdomen is soft. There is no mass.     Tenderness: There is abdominal tenderness (LUQ). There is no guarding.  Musculoskeletal:        General: No swelling. Normal range of motion.     Cervical back: Neck supple.  Skin:    General: Skin is warm and dry.  Neurological:     General: No focal deficit present.     Mental Status: She is alert.  Psychiatric:        Mood and Affect: Mood normal.     ED Results / Procedures / Treatments   EKG EKG Interpretation  Date/Time:  Thursday January 29 2022 02:57:38 EDT Ventricular Rate:  78 PR Interval:  168 QRS Duration: 93 QT Interval:  404 QTC Calculation: 461 R Axis:   49 Text Interpretation: Sinus rhythm Normal ECG No old tracing to compare Confirmed by Calvert Cantor [phone]) on 01/29/2022 3:47:56 AM  Procedures Procedures  Medications Ordered in the ED Medications  iohexol (OMNIPAQUE) 300 MG/ML solution 100 mL (100 mLs Intravenous Contrast Given 01/29/22 0522)    Initial Impression and Plan  Patient with persistent vague LUQ abdominal pain. Exam is benign. CBC, BMP and lipase are normal. Given duration of symptoms, will send for CT to rule out an acute process.   ED Course   Clinical Course as of 01/29/22 0643  Thu Jan 29, 2022  0619 UA is clear. I personally viewed the images from radiology studies and agree with radiologist interpretation: CT shows R sided hydronephrosis which doesn't explain her left sided abdominal pain. Other incidental findings discussed with the patient but also not in areas of her pain. Will plan follow up with urology regarding her hydronephrosis. Continued investigation by PCP/GI for her Left sided pain.   [CS]    Clinical  Course User Index [CS] Truddie Hidden, MD     MDM Rules/Calculators/A&P Medical Decision Making Problems Addressed: Hydronephrosis of right kidney: undiagnosed new problem with uncertain prognosis Liver lesion: undiagnosed new problem with uncertain prognosis LUQ abdominal pain: acute illness or injury  Amount and/or Complexity of Data Reviewed Labs: ordered. Decision-making details documented in ED Course. Radiology: ordered and independent interpretation performed. Decision-making details documented in ED Course.  Risk Prescription drug management.    Final Clinical Impression(s) / ED Diagnoses Final diagnoses:  LUQ abdominal pain  Hydronephrosis of right kidney  Liver lesion    Rx / DC Orders ED Discharge Orders     None        Truddie Hidden, MD 01/29/22 [phone]

## 2022-01-29 NOTE — Progress Notes (Signed)
Subjective:  Patient ID: Donna Lynch, female    DOB: 10-Dec-1975, 46 y.o.   MRN: [id number]  Patient Care Team: Dettinger, Fransisca Kaufmann, MD as PCP - General (Family Medicine) Eloise Harman, DO as Consulting Physician (Internal Medicine)   Chief Complaint:  Follow-up and Abdominal Pain   HPI: Donna Lynch is a 46 y.o. female presenting on 01/29/2022 for Follow-up and Abdominal Pain   Pt presents for follow up after ED visit this morning for abdominal pain. She had an extensive workup including urinalysis, blood work, and CT scan of abdomen. CT abdomen did reveal a possible liver hemangioma and minimal right sided hydronephrosis without obstruction. Workup otherwise unremarkable. She has a referral to GI and urology pending but came to office today for evaluation. Pt is anxious about findings on CT. Reassurance provided. No weight loss, fever, chills, night sweats, confusion, or weakness. States she does have abdominal cramping and "contraction like pain" in the epigastric region. She denies n/v/d or constipation.     Relevant past medical, surgical, family, and social history reviewed and updated as indicated.  Allergies and medications reviewed and updated. Data reviewed: Chart in Epic.   Past Medical History:  Diagnosis Date   Anemia    Hypothyroid 2020   Hypothyroidism 07/29/2020   Thyroid disease     Past Surgical History:  Procedure Laterality Date   CHOLECYSTECTOMY     NECK SURGERY      Social History   Socioeconomic History   Marital status: Legally Separated    Spouse name: Not on file   Number of children: Not on file   Years of education: Not on file   Highest education level: Not on file  Occupational History   Not on file  Tobacco Use   Smoking status: Former    Types: Cigarettes    Quit date: 08/17/2010    Years since quitting: 11.4   Smokeless tobacco: Never  Vaping Use   Vaping Use: Never used  Substance and Sexual Activity   Alcohol use:  Yes    Comment: socially   Drug use: Never   Sexual activity: Not Currently  Other Topics Concern   Not on file  Social History Narrative   Not on file   Social Determinants of Health   Financial Resource Strain: Not on file  Food Insecurity: Not on file  Transportation Needs: Not on file  Physical Activity: Not on file  Stress: Not on file  Social Connections: Not on file  Intimate Partner Violence: Not on file    Outpatient Encounter Medications as of 01/29/2022  Medication Sig   dicyclomine (BENTYL) 20 MG tablet Take 1 tablet (20 mg total) by mouth every 6 (six) hours.   phentermine 37.5 MG capsule Take 37.5 mg by mouth every morning.   SYNTHROID 150 MCG tablet Take 1 tablet (150 mcg total) by mouth daily before breakfast.   Vitamin D, Ergocalciferol, (DRISDOL) 1.25 MG (50000 UNIT) CAPS capsule Take 1 capsule (50,000 Units total) by mouth every 7 (seven) days.   [DISCONTINUED] fluconazole (DIFLUCAN) 150 MG tablet 1 po q week x 4 weeks (Patient not taking: Reported on 01/29/2022)   No facility-administered encounter medications on file as of 01/29/2022.    Allergies  Allergen Reactions   Other Shortness Of Breath    Avoids ghost peppers   Penicillins     Review of Systems  Constitutional:  Negative for activity change, appetite change, chills, diaphoresis, fatigue, fever and unexpected  weight change.  HENT: Negative.    Eyes: Negative.   Respiratory:  Negative for cough, chest tightness and shortness of breath.   Cardiovascular:  Negative for chest pain, palpitations and leg swelling.  Gastrointestinal:  Positive for abdominal distention and abdominal pain. Negative for anal bleeding, blood in stool, constipation, diarrhea, nausea and vomiting.  Endocrine: Negative.   Genitourinary:  Negative for decreased urine volume, difficulty urinating, dysuria, frequency and urgency.  Musculoskeletal:  Negative for arthralgias and myalgias.  Skin: Negative.    Allergic/Immunologic: Negative.   Neurological:  Negative for dizziness, weakness and headaches.  Hematological: Negative.   Psychiatric/Behavioral:  Negative for confusion, hallucinations, sleep disturbance and suicidal ideas. The patient is nervous/anxious.   All other systems reviewed and are negative.       Objective:  BP 125/87   Pulse 80   Temp 98.8 F (37.1 C)   Ht 5' 1.75" (1.568 m)   Wt 190 lb 0.6 oz (86.2 kg)   LMP  (LMP Unknown) Comment: 2-3 months ago  SpO2 98%   BMI 35.04 kg/m    Wt Readings from Last 3 Encounters:  01/29/22 190 lb 0.6 oz (86.2 kg)  01/29/22 190 lb (86.2 kg)  01/21/22 192 lb (87.1 kg)    Physical Exam Vitals and nursing note reviewed.  Constitutional:      General: She is not in acute distress.    Appearance: Normal appearance. She is well-developed and well-groomed. She is obese. She is not ill-appearing, toxic-appearing or diaphoretic.  HENT:     Head: Normocephalic and atraumatic.     Jaw: There is normal jaw occlusion.     Right Ear: Hearing normal.     Left Ear: Hearing normal.     Nose: Nose normal.     Mouth/Throat:     Lips: Pink.     Mouth: Mucous membranes are moist.     Pharynx: Oropharynx is clear. Uvula midline.  Eyes:     General: Lids are normal.     Pupils: Pupils are equal, round, and reactive to light.  Neck:     Thyroid: No thyroid mass, thyromegaly or thyroid tenderness.     Vascular: No carotid bruit or JVD.     Trachea: Trachea and phonation normal.  Cardiovascular:     Rate and Rhythm: Normal rate and regular rhythm.     Chest Wall: PMI is not displaced.     Pulses: Normal pulses.     Heart sounds: Normal heart sounds. No murmur heard.    No friction rub. No gallop.  Pulmonary:     Effort: Pulmonary effort is normal.     Breath sounds: Normal breath sounds. No wheezing.  Abdominal:     General: Bowel sounds are normal. There is no distension or abdominal bruit.     Palpations: Abdomen is soft. There is  no hepatomegaly or splenomegaly.  Musculoskeletal:     Cervical back: Normal range of motion and neck supple.     Right lower leg: No edema.     Left lower leg: No edema.  Lymphadenopathy:     Cervical: No cervical adenopathy.  Skin:    General: Skin is warm and dry.     Capillary Refill: Capillary refill takes less than 2 seconds.     Coloration: Skin is not cyanotic, jaundiced or pale.     Findings: No rash.  Neurological:     General: No focal deficit present.     Mental Status: She is alert  and oriented to person, place, and time.     Sensory: Sensation is intact.     Motor: Motor function is intact.     Coordination: Coordination is intact.     Gait: Gait is intact.     Deep Tendon Reflexes: Reflexes are normal and symmetric.  Psychiatric:        Attention and Perception: Attention and perception normal.        Mood and Affect: Mood is anxious. Affect is tearful.        Speech: Speech normal.        Behavior: Behavior normal. Behavior is cooperative.        Thought Content: Thought content normal.        Cognition and Memory: Cognition and memory normal.        Judgment: Judgment normal.     Results for orders placed or performed during the hospital encounter of 01/29/22  Lipase, blood  Result Value Ref Range   Lipase 32 11 - 51 U/L  Comprehensive metabolic panel  Result Value Ref Range   Sodium 136 135 - 145 mmol/L   Potassium 3.8 3.5 - 5.1 mmol/L   Chloride 102 98 - 111 mmol/L   CO2 28 22 - 32 mmol/L   Glucose, Bld 110 (H) 70 - 99 mg/dL   BUN 12 6 - 20 mg/dL   Creatinine, Ser 0.72 0.44 - 1.00 mg/dL   Calcium 9.2 8.9 - 10.3 mg/dL   Total Protein 7.3 6.5 - 8.1 g/dL   Albumin 4.3 3.5 - 5.0 g/dL   AST 17 15 - 41 U/L   ALT 16 0 - 44 U/L   Alkaline Phosphatase 78 38 - 126 U/L   Total Bilirubin 0.7 0.3 - 1.2 mg/dL   GFR, Estimated >60 >60 mL/min   Anion gap 6 5 - 15  CBC  Result Value Ref Range   WBC 3.9 (L) 4.0 - 10.5 K/uL   RBC 4.67 3.87 - 5.11 MIL/uL    Hemoglobin 12.4 12.0 - 15.0 g/dL   HCT 39.1 36.0 - 46.0 %   MCV 83.7 80.0 - 100.0 fL   MCH 26.6 26.0 - 34.0 pg   MCHC 31.7 30.0 - 36.0 g/dL   RDW 15.5 11.5 - 15.5 %   Platelets 274 150 - 400 K/uL   nRBC 0.0 0.0 - 0.2 %  Urinalysis, Routine w reflex microscopic Urine, Clean Catch  Result Value Ref Range   Color, Urine STRAW (A) YELLOW   APPearance CLEAR CLEAR   Specific Gravity, Urine 1.006 1.005 - 1.030   pH 7.0 5.0 - 8.0   Glucose, UA NEGATIVE NEGATIVE mg/dL   Hgb urine dipstick NEGATIVE NEGATIVE   Bilirubin Urine NEGATIVE NEGATIVE   Ketones, ur NEGATIVE NEGATIVE mg/dL   Protein, ur NEGATIVE NEGATIVE mg/dL   Nitrite NEGATIVE NEGATIVE   Leukocytes,Ua NEGATIVE NEGATIVE  POC urine preg, ED  Result Value Ref Range   Preg Test, Ur NEGATIVE NEGATIVE       Pertinent labs & imaging results that were available during my care of the patient were reviewed by me and considered in my medical decision making.  Assessment & Plan:  Angeleah was seen today for follow-up.  Diagnoses and all orders for this visit:  Abdominal cramping Ongoing symptoms. Pt aware this is likely anxiety related. Workup in ED this morning without acute findings. Will trial Bentyl as prescribed. Awaiting GI consult.  -     dicyclomine (BENTYL) 20 MG tablet; Take 1  tablet (20 mg total) by mouth every 6 (six) hours.  Liver lesion Incidental finding on abdominal CT during ED visit, will order MRI as suggested.  -     MR Abdomen W Wo Contrast; Future     Continue all other maintenance medications.  Follow up plan: Return if symptoms worsen or fail to improve.   Continue healthy lifestyle choices, including diet (rich in fruits, vegetables, and lean proteins, and low in salt and simple carbohydrates) and exercise (at least 30 minutes of moderate physical activity daily).   The above assessment and management plan was discussed with the patient. The patient verbalized understanding of and has agreed to the  management plan. Patient is aware to call the clinic if they develop any new symptoms or if symptoms persist or worsen. Patient is aware when to return to the clinic for a follow-up visit. Patient educated on when it is appropriate to go to the emergency department.   Monia Pouch, FNP-C [facility] [phone]

## 2022-01-29 NOTE — ED Triage Notes (Signed)
C/o abdominal LUQ pain that has intensified over the past 2 weeks. Denies d/v. Some nausea that started a couple of days ago. Pt states when she pushes on tender area, pain radiates down into her abdomen, radiates to the right across abdomen, and radiates up into the left side of neck. Pt was seen at [facility] this past weekend for the same.

## 2022-02-01 ENCOUNTER — Encounter: Payer: Self-pay | Admitting: Family Medicine

## 2022-02-02 ENCOUNTER — Telehealth: Payer: Self-pay | Admitting: Emergency Medicine

## 2022-02-02 ENCOUNTER — Other Ambulatory Visit: Payer: Self-pay | Admitting: Nurse Practitioner

## 2022-02-02 NOTE — Telephone Encounter (Signed)
TC from patient given Message from front desk. She states that she was seen in ER and here. She was told that her kidneys were swollen. She is having chills.   LM for patient to return call.

## 2022-02-02 NOTE — Telephone Encounter (Signed)
With what you are describing you are best to go to the ED.  I called patient and she said she made an appointment with urologist and have an appointment on Friday.

## 2022-02-05 ENCOUNTER — Encounter: Payer: Self-pay | Admitting: *Deleted

## 2022-02-09 ENCOUNTER — Encounter: Payer: Self-pay | Admitting: Obstetrics & Gynecology

## 2022-02-09 ENCOUNTER — Ambulatory Visit: Payer: No Typology Code available for payment source | Admitting: Obstetrics & Gynecology

## 2022-02-09 ENCOUNTER — Ambulatory Visit (INDEPENDENT_AMBULATORY_CARE_PROVIDER_SITE_OTHER): Payer: No Typology Code available for payment source | Admitting: Obstetrics & Gynecology

## 2022-02-09 ENCOUNTER — Other Ambulatory Visit (HOSPITAL_COMMUNITY)
Admission: RE | Admit: 2022-02-09 | Discharge: 2022-02-09 | Disposition: A | Discharge status: Home / Self Care | Payer: No Typology Code available for payment source | Source: Ambulatory Visit | Attending: Obstetrics & Gynecology | Admitting: Obstetrics & Gynecology

## 2022-02-09 VITALS — BP 123/87 | HR 66 | Ht 61.75 in | Wt 195.0 lb

## 2022-02-09 DIAGNOSIS — Z01419 Encounter for gynecological examination (general) (routine) without abnormal findings: Secondary | ICD-10-CM | POA: Diagnosis present

## 2022-02-09 DIAGNOSIS — Z87410 Personal history of cervical dysplasia: Secondary | ICD-10-CM

## 2022-02-09 DIAGNOSIS — M940 Chondrocostal junction syndrome [Tietze]: Secondary | ICD-10-CM | POA: Diagnosis not present

## 2022-02-11 LAB — CYTOLOGY - PAP
Comment: NEGATIVE
High risk HPV: NEGATIVE

## 2022-02-12 ENCOUNTER — Ambulatory Visit (HOSPITAL_COMMUNITY)
Admission: RE | Admit: 2022-02-12 | Discharge: 2022-02-12 | Disposition: A | Discharge status: Home / Self Care | Payer: No Typology Code available for payment source | Source: Ambulatory Visit | Attending: Family Medicine | Admitting: Family Medicine

## 2022-02-12 DIAGNOSIS — K769 Liver disease, unspecified: Secondary | ICD-10-CM | POA: Insufficient documentation

## 2022-02-12 MED ORDER — GADOBUTROL 1 MMOL/ML IV SOLN
10.0000 mL | Freq: Once | INTRAVENOUS | Status: AC | PRN
Start: 2022-02-12 — End: 2022-02-12
  Administered 2022-02-12: 10 mL via INTRAVENOUS

## 2022-02-16 ENCOUNTER — Other Ambulatory Visit (HOSPITAL_COMMUNITY): Payer: Self-pay | Admitting: Urology

## 2022-02-16 DIAGNOSIS — Q6211 Congenital occlusion of ureteropelvic junction: Secondary | ICD-10-CM

## 2022-02-20 ENCOUNTER — Encounter (HOSPITAL_COMMUNITY): Payer: No Typology Code available for payment source

## 2022-02-24 ENCOUNTER — Ambulatory Visit: Payer: No Typology Code available for payment source | Admitting: General Surgery

## 2022-03-02 ENCOUNTER — Encounter (HOSPITAL_COMMUNITY): Payer: Self-pay

## 2022-03-02 ENCOUNTER — Encounter (HOSPITAL_COMMUNITY): Admission: RE | Admit: 2022-03-02 | Payer: No Typology Code available for payment source | Source: Ambulatory Visit

## 2022-07-15 ENCOUNTER — Encounter: Payer: Self-pay | Admitting: Family Medicine

## 2022-07-15 ENCOUNTER — Ambulatory Visit (INDEPENDENT_AMBULATORY_CARE_PROVIDER_SITE_OTHER): Payer: No Typology Code available for payment source | Admitting: Family Medicine

## 2022-07-15 VITALS — BP 117/82 | HR 77 | Temp 97.4°F | Ht 61.75 in | Wt 187.2 lb

## 2022-07-15 DIAGNOSIS — N3000 Acute cystitis without hematuria: Secondary | ICD-10-CM

## 2022-07-15 DIAGNOSIS — M545 Low back pain, unspecified: Secondary | ICD-10-CM | POA: Diagnosis not present

## 2022-07-15 DIAGNOSIS — G44209 Tension-type headache, unspecified, not intractable: Secondary | ICD-10-CM

## 2022-07-15 DIAGNOSIS — G8929 Other chronic pain: Secondary | ICD-10-CM

## 2022-07-15 DIAGNOSIS — R300 Dysuria: Secondary | ICD-10-CM

## 2022-07-15 LAB — URINALYSIS, ROUTINE W REFLEX MICROSCOPIC
Bilirubin, UA: NEGATIVE
Glucose, UA: NEGATIVE
Nitrite, UA: NEGATIVE
Protein,UA: NEGATIVE
RBC, UA: NEGATIVE
Specific Gravity, UA: >1.03 — ABNORMAL HIGH (ref 1.005–1.030)
Urobilinogen, Ur: 0.2 mg/dL (ref 0.2–1.0)
pH, UA: 5 (ref 5.0–7.5)

## 2022-07-15 LAB — MICROSCOPIC EXAMINATION
RBC, Urine: NONE SEEN /hpf (ref 0–2)
Renal Epithel, UA: NONE SEEN /hpf

## 2022-07-15 MED ORDER — NAPROXEN 500 MG PO TABS: 500.0000 mg | ORAL_TABLET | Freq: Two times a day (BID) | ORAL | 0 refills | Status: DC

## 2022-07-15 MED ORDER — SULFAMETHOXAZOLE-TRIMETHOPRIM 800-160 MG PO TABS: 1.0000 | ORAL_TABLET | Freq: Two times a day (BID) | ORAL | 0 refills | Status: AC

## 2022-07-15 NOTE — Progress Notes (Signed)
Subjective:  Patient ID: Donna Lynch, female    DOB: 12/03/1975, 46 y.o.   MRN: [id number]  Patient Care Team: Dettinger, Fransisca Kaufmann, MD as PCP - General (Family Medicine) Eloise Harman, DO as Consulting Physician (Internal Medicine)   Chief Complaint:  Back Pain (X 8 weeks ) and Headache (X 8 weeks )   HPI: Donna Lynch is a 46 y.o. female presenting on 07/15/2022 for Back Pain (X 8 weeks ) and Headache (X 8 weeks )   Back Pain This is a recurrent problem. Episode onset: 8 weeks ago. The problem has been waxing and waning since onset. The pain is present in the lumbar spine. The pain does not radiate. The pain is moderate. The symptoms are aggravated by bending, sitting, standing, stress and position. Associated symptoms include abdominal pain, dysuria and headaches. Pertinent negatives include no bladder incontinence, bowel incontinence, chest pain, fever, leg pain, numbness, paresis, paresthesias, pelvic pain, perianal numbness, tingling, weakness or weight loss. She has tried nothing for the symptoms.  Headache  This is a recurrent (8 weeks ago) problem. The problem has been waxing and waning. The pain is located in the Parietal, temporal and frontal region. The pain does not radiate. The quality of the pain is described as throbbing and band-like. The pain is moderate. Associated symptoms include abdominal pain, back pain and dizziness. Pertinent negatives include no abnormal behavior, anorexia, blurred vision, coughing, drainage, ear pain, eye pain, eye redness, eye watering, facial sweating, fever, hearing loss, insomnia, loss of balance, muscle aches, nausea, neck pain, numbness, phonophobia, photophobia, rhinorrhea, scalp tenderness, seizures, sinus pressure, sore throat, swollen glands, tingling, tinnitus, visual change, vomiting, weakness or weight loss. Nothing aggravates the symptoms. She has tried nothing for the symptoms.  Dysuria  This is a recurrent problem. Episode  onset: 2 weeks ago. The problem has been waxing and waning. The quality of the pain is described as aching and burning. The pain is mild. There has been no fever. She is Not sexually active. There is No history of pyelonephritis. Associated symptoms include flank pain. Pertinent negatives include no chills, discharge, frequency, hematuria, hesitancy, nausea, possible pregnancy, sweats, urgency or vomiting. She has tried nothing for the symptoms. The treatment provided no relief.    Relevant past medical, surgical, family, and social history reviewed and updated as indicated.  Allergies and medications reviewed and updated. Data reviewed: Chart in Epic.   Past Medical History:  Diagnosis Date   Anemia    Hypothyroid 2020   Hypothyroidism 07/29/2020   Thyroid disease     Past Surgical History:  Procedure Laterality Date   CHOLECYSTECTOMY     NECK SURGERY     TUBAL LIGATION Bilateral 2011    Social History   Socioeconomic History   Marital status: Legally Separated    Spouse name: Not on file   Number of children: Not on file   Years of education: Not on file   Highest education level: Not on file  Occupational History   Not on file  Tobacco Use   Smoking status: Former    Types: Cigarettes    Quit date: 08/17/2010    Years since quitting: 11.9   Smokeless tobacco: Never  Vaping Use   Vaping Use: Never used  Substance and Sexual Activity   Alcohol use: Yes    Comment: socially   Drug use: Never   Sexual activity: Not Currently    Birth control/protection: None  Other Topics Concern  Not on file  Social History Narrative   Not on file   Social Determinants of Health   Financial Resource Strain: Not on file  Food Insecurity: Not on file  Transportation Needs: Not on file  Physical Activity: Not on file  Stress: Not on file  Social Connections: Not on file  Intimate Partner Violence: Not on file    Outpatient Encounter Medications as of 07/15/2022  Medication  Sig   dicyclomine (BENTYL) 20 MG tablet Take 1 tablet (20 mg total) by mouth every 6 (six) hours.   phentermine 37.5 MG capsule Take 37.5 mg by mouth every morning.   sulfamethoxazole-trimethoprim (BACTRIM DS) 800-160 MG tablet Take 1 tablet by mouth 2 (two) times daily for 7 days.   SYNTHROID 150 MCG tablet Take 1 tablet (150 mcg total) by mouth daily before breakfast.   Vitamin D, Ergocalciferol, (DRISDOL) 1.25 MG (50000 UNIT) CAPS capsule Take 1 capsule (50,000 Units total) by mouth every 7 (seven) days.   [DISCONTINUED] naproxen (NAPROSYN) 500 MG tablet Take 1 tablet (500 mg total) by mouth 2 (two) times daily with a meal.   No facility-administered encounter medications on file as of 07/15/2022.    Allergies  Allergen Reactions   Other Shortness Of Breath    Avoids ghost peppers   Penicillins     Review of Systems  Constitutional:  Negative for chills, fever and weight loss.  HENT:  Negative for ear pain, hearing loss, rhinorrhea, sinus pressure, sore throat and tinnitus.   Eyes:  Negative for blurred vision, photophobia, pain and redness.  Respiratory:  Negative for cough and shortness of breath.   Cardiovascular:  Negative for chest pain.  Gastrointestinal:  Positive for abdominal pain. Negative for abdominal distention, anal bleeding, anorexia, blood in stool, bowel incontinence, constipation, diarrhea, nausea and vomiting.  Genitourinary:  Positive for dysuria and flank pain. Negative for bladder incontinence, decreased urine volume, difficulty urinating, dyspareunia, enuresis, frequency, genital sores, hematuria, hesitancy, menstrual problem, pelvic pain, urgency, vaginal bleeding, vaginal discharge and vaginal pain.  Musculoskeletal:  Positive for back pain. Negative for neck pain.  Neurological:  Positive for dizziness and headaches. Negative for tingling, seizures, weakness, numbness, paresthesias and loss of balance.  Psychiatric/Behavioral:  The patient does not have  insomnia.   All other systems reviewed and are negative.       Objective:  BP 117/82   Pulse 77   Temp (!) 97.4 F (36.3 C) (Temporal)   Ht 5' 1.75" (1.568 m)   Wt 187 lb 3.2 oz (84.9 kg)   SpO2 95%   BMI 34.52 kg/m    Wt Readings from Last 3 Encounters:  07/15/22 187 lb 3.2 oz (84.9 kg)  02/09/22 195 lb (88.5 kg)  02/09/22 195 lb (88.5 kg)    Physical Exam Vitals and nursing note reviewed.  Constitutional:      General: She is not in acute distress.    Appearance: Normal appearance. She is well-developed and well-groomed. She is obese. She is not ill-appearing, toxic-appearing or diaphoretic.  HENT:     Head: Normocephalic and atraumatic.     Jaw: There is normal jaw occlusion.     Right Ear: Hearing normal.     Left Ear: Hearing normal.     Nose: Nose normal.     Mouth/Throat:     Lips: Pink.     Mouth: Mucous membranes are moist.     Pharynx: Oropharynx is clear. Uvula midline.  Eyes:     General: Lids  are normal.     Conjunctiva/sclera: Conjunctivae normal.     Pupils: Pupils are equal, round, and reactive to light.  Neck:     Thyroid: No thyroid mass, thyromegaly or thyroid tenderness.     Vascular: No carotid bruit or JVD.     Trachea: Trachea and phonation normal.  Cardiovascular:     Rate and Rhythm: Normal rate and regular rhythm.     Chest Wall: PMI is not displaced.     Pulses: Normal pulses.     Heart sounds: Normal heart sounds. No murmur heard.    No friction rub. No gallop.  Pulmonary:     Effort: Pulmonary effort is normal. No respiratory distress.     Breath sounds: Normal breath sounds. No wheezing.  Abdominal:     General: Bowel sounds are normal. There is no distension or abdominal bruit.     Palpations: Abdomen is soft. There is no hepatomegaly, splenomegaly or mass.     Tenderness: There is no abdominal tenderness. There is no right CVA tenderness, left CVA tenderness, guarding or rebound.     Hernia: No hernia is present.   Musculoskeletal:        General: Normal range of motion.     Cervical back: Normal, normal range of motion and neck supple.     Thoracic back: Normal.     Lumbar back: Normal.     Right hip: Normal.     Left hip: Normal.     Right lower leg: No edema.     Left lower leg: No edema.  Lymphadenopathy:     Cervical: No cervical adenopathy.  Skin:    General: Skin is warm and dry.     Capillary Refill: Capillary refill takes less than 2 seconds.     Coloration: Skin is not cyanotic, jaundiced or pale.     Findings: No rash.  Neurological:     General: No focal deficit present.     Mental Status: She is alert and oriented to person, place, and time.     Cranial Nerves: No cranial nerve deficit.     Sensory: Sensation is intact. No sensory deficit.     Motor: Motor function is intact. No weakness.     Coordination: Coordination is intact. Coordination normal.     Gait: Gait is intact. Gait normal.     Deep Tendon Reflexes: Reflexes are normal and symmetric. Reflexes normal.  Psychiatric:        Attention and Perception: Attention and perception normal.        Mood and Affect: Affect normal. Mood is anxious.        Speech: Speech normal.        Behavior: Behavior normal. Behavior is cooperative.        Thought Content: Thought content normal.        Cognition and Memory: Cognition and memory normal.        Judgment: Judgment normal.     Results for orders placed or performed in visit on 02/09/22  Cytology - PAP( [place])  Result Value Ref Range   High risk HPV Negative    Adequacy      Satisfactory for evaluation; transformation zone component PRESENT.   Diagnosis - Low grade squamous intraepithelial lesion (LSIL) (A)    Comment Normal Reference Range HPV - Negative        Pertinent labs & imaging results that were available during my care of the patient were reviewed by me and  considered in my medical decision making.  Assessment & Plan:  Jorgina was seen today for back  pain, headache and dizziness.  Diagnoses and all orders for this visit:  Dysuria Urinalysis consistent with UTI. Culture added.  -     Urinalysis, Routine w reflex microscopic -     Ambulatory referral to Urology  Acute cystitis without hematuria Will treat with below. Pt would like referral to urology due to recurrent UTIs and not UVJ blockage on prior imaging. Referral placed. No red flags present concerning for acute pyelonephritis or renal stones. Report new, worsening, or persistent symptoms. Culture pending, will change therapy if warranted.  -     sulfamethoxazole-trimethoprim (BACTRIM DS) 800-160 MG tablet; Take 1 tablet by mouth 2 (two) times daily for 7 days. -     Urine Culture -     Ambulatory referral to Urology  Chronic bilateral low back pain without sciatica Pt declined naproxen therapy. Discussed good over the counter options for pain management. Follow up with PCP if symptoms persist.   Tension headache No red flags present. Declined naproxen therapy. Discussed over the counter options. Aware to follow up with PCP for continued symptoms.     Continue all other maintenance medications.  Follow up plan: Return if symptoms worsen or fail to improve.   Continue healthy lifestyle choices, including diet (rich in fruits, vegetables, and lean proteins, and low in salt and simple carbohydrates) and exercise (at least 30 minutes of moderate physical activity daily).  Educational handout given for UTI  The above assessment and management plan was discussed with the patient. The patient verbalized understanding of and has agreed to the management plan. Patient is aware to call the clinic if they develop any new symptoms or if symptoms persist or worsen. Patient is aware when to return to the clinic for a follow-up visit. Patient educated on when it is appropriate to go to the emergency department.   Monia Pouch, FNP-C [facility] [phone]

## 2022-07-17 LAB — URINE CULTURE: Organism ID, Bacteria: NO GROWTH

## 2022-07-23 ENCOUNTER — Ambulatory Visit (HOSPITAL_COMMUNITY)
Admission: RE | Admit: 2022-07-23 | Discharge: 2022-07-23 | Disposition: A | Discharge status: Home / Self Care | Payer: No Typology Code available for payment source | Source: Ambulatory Visit | Attending: Urology | Admitting: Urology

## 2022-07-23 DIAGNOSIS — Q6211 Congenital occlusion of ureteropelvic junction: Secondary | ICD-10-CM | POA: Diagnosis present

## 2022-07-23 MED ORDER — FUROSEMIDE 10 MG/ML IJ SOLN: 40.0000 mg | Freq: Once | INTRAMUSCULAR | Status: AC

## 2022-07-23 MED ORDER — TECHNETIUM TC 99M MERTIATIDE
4.7000 | Freq: Once | INTRAVENOUS | Status: AC
  Administered 2022-07-23: 4.7 via INTRAVENOUS

## 2022-07-23 MED ORDER — FUROSEMIDE 10 MG/ML IJ SOLN
INTRAMUSCULAR | Status: AC
  Administered 2022-07-23: 42.5 mg via INTRAVENOUS
  Filled 2022-07-23: qty 4

## 2022-07-24 ENCOUNTER — Ambulatory Visit (INDEPENDENT_AMBULATORY_CARE_PROVIDER_SITE_OTHER): Payer: No Typology Code available for payment source

## 2022-07-24 ENCOUNTER — Ambulatory Visit (INDEPENDENT_AMBULATORY_CARE_PROVIDER_SITE_OTHER): Payer: No Typology Code available for payment source | Admitting: Family Medicine

## 2022-07-24 ENCOUNTER — Encounter: Payer: Self-pay | Admitting: Family Medicine

## 2022-07-24 ENCOUNTER — Other Ambulatory Visit (HOSPITAL_COMMUNITY)
Admission: RE | Admit: 2022-07-24 | Discharge: 2022-07-24 | Disposition: A | Discharge status: Home / Self Care | Payer: No Typology Code available for payment source | Source: Ambulatory Visit | Attending: Family Medicine | Admitting: Family Medicine

## 2022-07-24 VITALS — BP 126/88 | HR 63 | Ht 61.0 in | Wt 193.0 lb

## 2022-07-24 DIAGNOSIS — R87612 Low grade squamous intraepithelial lesion on cytologic smear of cervix (LGSIL): Secondary | ICD-10-CM | POA: Diagnosis present

## 2022-07-24 DIAGNOSIS — M5136 Other intervertebral disc degeneration, lumbar region: Secondary | ICD-10-CM

## 2022-07-24 DIAGNOSIS — E039 Hypothyroidism, unspecified: Secondary | ICD-10-CM | POA: Diagnosis not present

## 2022-07-24 DIAGNOSIS — Z0001 Encounter for general adult medical examination with abnormal findings: Secondary | ICD-10-CM

## 2022-07-24 DIAGNOSIS — Z0000 Encounter for general adult medical examination without abnormal findings: Secondary | ICD-10-CM | POA: Diagnosis present

## 2022-07-24 DIAGNOSIS — D1803 Hemangioma of intra-abdominal structures: Secondary | ICD-10-CM | POA: Insufficient documentation

## 2022-07-24 DIAGNOSIS — E782 Mixed hyperlipidemia: Secondary | ICD-10-CM

## 2022-07-24 DIAGNOSIS — E559 Vitamin D deficiency, unspecified: Secondary | ICD-10-CM

## 2022-07-24 MED ORDER — SYNTHROID 150 MCG PO TABS: 150.0000 ug | ORAL_TABLET | Freq: Every day | ORAL | 1 refills | Status: DC

## 2022-07-24 MED ORDER — BACLOFEN 10 MG PO TABS: 10.0000 mg | ORAL_TABLET | Freq: Every evening | ORAL | 2 refills | Status: DC | PRN

## 2022-07-24 NOTE — Progress Notes (Signed)
BP 126/88   Pulse 63   Ht _0  (1.549 m)   Wt 193 lb (87.5 kg)   LMP 02/06/2022 (Approximate)   SpO2 97%   BMI 36.47 kg/m    Subjective:   Patient ID: Donna Lynch, female    DOB: [birth year], 46 y.o.   MRN: [id number]  HPI: Donna Lynch is a 46 y.o. female presenting on 07/24/2022 for Medical Management of Chronic Issues (CPE)   HPI Well woman exam and physical Patient denies any chest pain, shortness of breath, headaches or vision issues, abdominal complaints, diarrhea, nausea, vomiting, or joint issues.  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  She says her biggest complaint today is low back pain.  She says she has had disc issues before and has seen Dr. Arnoldo Morale in the past.  She says she has been having a lot of lower back pain across her back and usually is intermittent but has been more constant recently.  Patient has seen a urologist for some hydronephrosis and they are following up on that with further testing.  Hypothyroidism recheck Patient is coming in for thyroid recheck today as well. They deny any issues with hair changes or heat or cold problems or diarrhea or constipation. They deny any chest pain or palpitations. They are currently on levothyroxine 150 micrograms   Hyperlipidemia Patient is coming in for recheck of his hyperlipidemia. The patient is currently taking no medicine currently. They deny any issues with myalgias or history of liver damage from it. They deny any focal numbness or weakness or chest pain.   Vitamin D deficiency Patient is coming in for recheck of her vitamin D deficiency.  She has taken vitamin D in the past but does not currently.  Relevant past medical, surgical, family and social history reviewed and updated as indicated. Interim medical history since our last visit reviewed. Allergies and medications reviewed and updated.  Review of Systems  Constitutional:  Negative for chills and fever.  HENT:   Negative for ear pain and tinnitus.   Eyes:  Negative for pain and visual disturbance.  Respiratory:  Negative for cough, chest tightness, shortness of breath and wheezing.   Cardiovascular:  Negative for chest pain, palpitations and leg swelling.  Gastrointestinal:  Negative for abdominal pain, blood in stool, constipation and diarrhea.  Genitourinary:  Negative for dysuria, hematuria and urgency.  Musculoskeletal:  Positive for arthralgias and back pain. Negative for gait problem and myalgias.  Skin:  Negative for rash.  Neurological:  Negative for dizziness, weakness, light-headedness and headaches.  Psychiatric/Behavioral:  Negative for agitation, behavioral problems and suicidal ideas.   All other systems reviewed and are negative.   Per HPI unless specifically indicated above   Allergies as of 07/24/2022       Reactions   Other Shortness Of Breath   Avoids ghost peppers   Penicillins         Medication List        Accurate as of July 24, 2022  9:41 AM. If you have any questions, ask your nurse or doctor.          STOP taking these medications    dicyclomine 20 MG tablet Commonly known as: BENTYL Stopped by: Worthy Rancher, MD   Vitamin D (Ergocalciferol) 1.25 MG (50000 UNIT) Caps capsule Commonly known as: DRISDOL Stopped by: Worthy Rancher, MD       TAKE these medications  phentermine 37.5 MG capsule Take 37.5 mg by mouth every morning.   Synthroid 150 MCG tablet Generic drug: levothyroxine Take 1 tablet (150 mcg total) by mouth daily before breakfast.   Vitamin D 50 MCG (2000 UT) tablet Take 2,000 Units by mouth daily.         Objective:   BP 126/88   Pulse 63   Ht _0  (1.549 m)   Wt 193 lb (87.5 kg)   LMP 02/06/2022 (Approximate)   SpO2 97%   BMI 36.47 kg/m   Wt Readings from Last 3 Encounters:  07/24/22 193 lb (87.5 kg)  07/15/22 187 lb 3.2 oz (84.9 kg)  02/09/22 195 lb (88.5 kg)    Physical Exam Vitals and  nursing note reviewed.  Constitutional:      General: She is not in acute distress.    Appearance: She is well-developed. She is not diaphoretic.  Eyes:     Conjunctiva/sclera: Conjunctivae normal.  Neck:     Thyroid: No thyromegaly.  Cardiovascular:     Rate and Rhythm: Normal rate and regular rhythm.     Heart sounds: Normal heart sounds. No murmur heard. Pulmonary:     Effort: Pulmonary effort is normal. No respiratory distress.     Breath sounds: Normal breath sounds. No wheezing.  Chest:  Breasts:    Breasts are symmetrical.     Right: No inverted nipple, mass, nipple discharge, skin change or tenderness.     Left: No inverted nipple, mass, nipple discharge, skin change or tenderness.  Abdominal:     General: Bowel sounds are normal. There is no distension.     Palpations: Abdomen is soft.     Tenderness: There is no abdominal tenderness. There is no guarding or rebound.  Genitourinary:    Exam position: Supine.     Labia:        Right: No rash or lesion.        Left: No rash or lesion.      Vagina: Normal.     Cervix: No cervical motion tenderness, discharge or friability.     Uterus: Not deviated, not enlarged, not fixed and not tender.      Adnexa:        Right: No mass or tenderness.         Left: No mass or tenderness.    Musculoskeletal:        General: No swelling. Normal range of motion.     Cervical back: Neck supple.  Lymphadenopathy:     Cervical: No cervical adenopathy.  Skin:    General: Skin is warm and dry.     Findings: No rash.  Neurological:     Mental Status: She is alert and oriented to person, place, and time.     Coordination: Coordination normal.  Psychiatric:        Behavior: Behavior normal.       Assessment & Plan:   Problem List Items Addressed This Visit       Endocrine   Hypothyroidism   Relevant Medications   SYNTHROID 150 MCG tablet   Other Relevant Orders   CBC with Differential/Platelet   TSH     Other   Mixed  hyperlipidemia   Relevant Orders   CBC with Differential/Platelet   Lipid panel   Vitamin D deficiency   Relevant Orders   VITAMIN D 25 Hydroxy (Vit-D Deficiency, Fractures)   Other Visit Diagnoses     Physical exam    -  Primary   Relevant Orders   Cytology - PAP([place])   CBC with Differential/Platelet   CMP14+EGFR   LGSIL on Pap smear of cervix       Relevant Orders   Cytology - PAP([place])   Degenerative disc disease, lumbar       Relevant Orders   DG Lumbar Spine 2-3 Views   Ambulatory referral to Neurosurgery       Will check blood work today, will recheck Pap smear, if abnormal then will refer to [facility] gynecology.  With low-grade squamous intraepithelial lesion last time.  Check blood work today as well. Follow up plan: Return in about 6 months (around 01/23/2023), or if symptoms worsen or fail to improve, for Thyroid recheck.  Counseling provided for all of the vaccine components Orders Placed This Encounter  Procedures   DG Lumbar Spine 2-3 Views   CBC with Differential/Platelet   CMP14+EGFR   Lipid panel   TSH   VITAMIN D 25 Hydroxy (Vit-D Deficiency, Fractures)   Ambulatory referral to Neurosurgery    Caryl Pina, MD [facility] 07/24/2022, 9:41 AM

## 2022-07-24 NOTE — Addendum Note (Signed)
Addended by: Caryl Pina on: 07/24/2022 10:57 AM   Modules accepted: Orders

## 2022-07-25 LAB — VITAMIN D 25 HYDROXY (VIT D DEFICIENCY, FRACTURES): Vit D, 25-Hydroxy: 36.8 ng/mL (ref 30.0–100.0)

## 2022-07-25 LAB — CBC WITH DIFFERENTIAL/PLATELET
Basophils Absolute: 0.1 10*3/uL (ref 0.0–0.2)
Basos: 1 %
EOS (ABSOLUTE): 0.2 10*3/uL (ref 0.0–0.4)
Eos: 4 %
Hematocrit: 40.9 % (ref 34.0–46.6)
Hemoglobin: 13.3 g/dL (ref 11.1–15.9)
Immature Grans (Abs): 0 10*3/uL (ref 0.0–0.1)
Immature Granulocytes: 0 %
Lymphocytes Absolute: 1.6 10*3/uL (ref 0.7–3.1)
Lymphs: 32 %
MCH: 27.6 pg (ref 26.6–33.0)
MCHC: 32.5 g/dL (ref 31.5–35.7)
MCV: 85 fL (ref 79–97)
Monocytes Absolute: 0.4 10*3/uL (ref 0.1–0.9)
Monocytes: 7 %
Neutrophils Absolute: 2.7 10*3/uL (ref 1.4–7.0)
Neutrophils: 56 %
Platelets: 343 10*3/uL (ref 150–450)
RBC: 4.82 x10E6/uL (ref 3.77–5.28)
RDW: 13 % (ref 11.7–15.4)
WBC: 4.9 10*3/uL (ref 3.4–10.8)

## 2022-07-25 LAB — CMP14+EGFR
ALT: 16 IU/L (ref 0–32)
AST: 18 IU/L (ref 0–40)
Albumin/Globulin Ratio: 2 (ref 1.2–2.2)
Albumin: 4.5 g/dL (ref 3.9–4.9)
Alkaline Phosphatase: 113 IU/L (ref 44–121)
BUN/Creatinine Ratio: 19 (ref 9–23)
BUN: 13 mg/dL (ref 6–24)
Bilirubin Total: 0.4 mg/dL (ref 0.0–1.2)
CO2: 26 mmol/L (ref 20–29)
Calcium: 9.7 mg/dL (ref 8.7–10.2)
Chloride: 102 mmol/L (ref 96–106)
Creatinine, Ser: 0.7 mg/dL (ref 0.57–1.00)
Globulin, Total: 2.3 g/dL (ref 1.5–4.5)
Glucose: 109 mg/dL — ABNORMAL HIGH (ref 70–99)
Potassium: 4.8 mmol/L (ref 3.5–5.2)
Sodium: 143 mmol/L (ref 134–144)
Total Protein: 6.8 g/dL (ref 6.0–8.5)
eGFR: 107 mL/min/{1.73_m2} (ref >59)

## 2022-07-25 LAB — TSH: TSH: 0.309 u[IU]/mL — ABNORMAL LOW (ref 0.450–4.500)

## 2022-07-25 LAB — LIPID PANEL
Chol/HDL Ratio: 3.7 ratio (ref 0.0–4.4)
Cholesterol, Total: 222 mg/dL — ABNORMAL HIGH (ref 100–199)
HDL: 60 mg/dL (ref >39)
LDL Chol Calc (NIH): 146 mg/dL — ABNORMAL HIGH (ref 0–99)
Triglycerides: 93 mg/dL (ref 0–149)
VLDL Cholesterol Cal: 16 mg/dL (ref 5–40)

## 2022-07-29 LAB — CYTOLOGY - PAP
Comment: NEGATIVE
Diagnosis: NEGATIVE
Diagnosis: REACTIVE
High risk HPV: NEGATIVE

## 2022-07-30 MED ORDER — EZETIMIBE 10 MG PO TABS: 10.0000 mg | ORAL_TABLET | Freq: Every day | ORAL | 3 refills | Status: DC

## 2022-07-30 NOTE — Addendum Note (Signed)
Addended by: Caryl Pina on: 07/30/2022 02:36 PM   Modules accepted: Orders

## 2022-08-14 ENCOUNTER — Telehealth: Payer: Self-pay | Admitting: Family Medicine

## 2022-08-14 ENCOUNTER — Encounter: Payer: Self-pay | Admitting: Family Medicine

## 2022-08-14 ENCOUNTER — Telehealth (INDEPENDENT_AMBULATORY_CARE_PROVIDER_SITE_OTHER): Payer: No Typology Code available for payment source | Admitting: Family Medicine

## 2022-08-14 DIAGNOSIS — M5136 Other intervertebral disc degeneration, lumbar region: Secondary | ICD-10-CM

## 2022-08-14 DIAGNOSIS — M5431 Sciatica, right side: Secondary | ICD-10-CM | POA: Diagnosis not present

## 2022-08-14 DIAGNOSIS — M5432 Sciatica, left side: Secondary | ICD-10-CM

## 2022-08-14 MED ORDER — PREDNISONE 20 MG PO TABS: 40.0000 mg | ORAL_TABLET | Freq: Every day | ORAL | 0 refills | Status: AC

## 2022-08-14 NOTE — Progress Notes (Signed)
Virtual Visit via MyChart Video Note Due to COVID-19 pandemic this visit was conducted virtually. This visit type was conducted due to national recommendations for restrictions regarding the COVID-19 Pandemic (e.g. social distancing, sheltering in place) in an effort to limit this patient's exposure and mitigate transmission in our community. All issues noted in this document were discussed and addressed.  A physical exam was not performed with this format.   I connected with Donna Lynch on 08/14/2022 at 1115 by MyChart Video and verified that I am speaking with the correct person using two identifiers. Donna Lynch is currently located at home and patient is currently with them during visit. The provider, Monia Pouch, FNP is located in their office at time of visit.  I discussed the limitations, risks, security and privacy concerns of performing an evaluation and management service by virtual visit and the availability of in person appointments. I also discussed with the patient that there may be a patient responsible charge related to this service. The patient expressed understanding and agreed to proceed.  Subjective:  Patient ID: Donna Lynch, female    DOB: [birth year], 46 y.o.   MRN: [id number]  Chief Complaint:  Back Pain   HPI: Donna Lynch is a 46 y.o. female presenting on 08/14/2022 for Back Pain   Back Pain This is a chronic problem. The problem occurs constantly. The problem has been waxing and waning since onset. The pain is present in the lumbar spine and gluteal. The quality of the pain is described as aching, burning, shooting and stabbing. The pain radiates to the left thigh and right thigh. The pain is severe. Exacerbated by: any movement. Associated symptoms include leg pain, numbness (bilateral thighs), paresthesias and tingling. Pertinent negatives include no abdominal pain, bladder incontinence, bowel incontinence, chest pain, dysuria, fever, headaches,  paresis, pelvic pain, perianal numbness, weakness or weight loss. She has tried bed rest, analgesics and muscle relaxant for the symptoms. The treatment provided mild relief.     Relevant past medical, surgical, family, and social history reviewed and updated as indicated.  Allergies and medications reviewed and updated.   Past Medical History:  Diagnosis Date   Anemia    Hypothyroid 2020   Hypothyroidism 07/29/2020   Thyroid disease     Past Surgical History:  Procedure Laterality Date   CHOLECYSTECTOMY     NECK SURGERY     TUBAL LIGATION Bilateral 2011    Social History   Socioeconomic History   Marital status: Legally Separated    Spouse name: Not on file   Number of children: Not on file   Years of education: Not on file   Highest education level: Not on file  Occupational History   Not on file  Tobacco Use   Smoking status: Former    Types: Cigarettes    Quit date: 08/17/2010    Years since quitting: 12.0   Smokeless tobacco: Never  Vaping Use   Vaping Use: Never used  Substance and Sexual Activity   Alcohol use: Yes    Comment: socially   Drug use: Never   Sexual activity: Not Currently    Birth control/protection: None  Other Topics Concern   Not on file  Social History Narrative   Not on file   Social Determinants of Health   Financial Resource Strain: Not on file  Food Insecurity: Not on file  Transportation Needs: Not on file  Physical Activity: Not on file  Stress: Not on file  Social Connections: Not on file  Intimate Partner Violence: Not on file    Outpatient Encounter Medications as of 08/14/2022  Medication Sig   predniSONE (DELTASONE) 20 MG tablet Take 2 tablets (40 mg total) by mouth daily with breakfast for 5 days. 2 po daily for 5 days   baclofen (LIORESAL) 10 MG tablet Take 1 tablet (10 mg total) by mouth at bedtime as needed for muscle spasms.   Cholecalciferol (VITAMIN D) 50 MCG (2000 UT) tablet Take 2,000 Units by mouth daily.    ezetimibe (ZETIA) 10 MG tablet Take 1 tablet (10 mg total) by mouth daily.   phentermine 37.5 MG capsule Take 37.5 mg by mouth every morning.   SYNTHROID 150 MCG tablet Take 1 tablet (150 mcg total) by mouth daily before breakfast.   No facility-administered encounter medications on file as of 08/14/2022.    Allergies  Allergen Reactions   Other Shortness Of Breath    Avoids ghost peppers   Penicillins     Review of Systems  Constitutional:  Negative for activity change, appetite change, chills, diaphoresis, fatigue, fever, unexpected weight change and weight loss.  HENT: Negative.    Eyes: Negative.  Negative for photophobia and visual disturbance.  Respiratory:  Negative for cough, chest tightness and shortness of breath.   Cardiovascular:  Negative for chest pain, palpitations and leg swelling.  Gastrointestinal:  Negative for abdominal pain, blood in stool, bowel incontinence, constipation, diarrhea, nausea and vomiting.  Endocrine: Negative.   Genitourinary:  Negative for bladder incontinence, decreased urine volume, difficulty urinating, dysuria, frequency, pelvic pain and urgency.  Musculoskeletal:  Positive for arthralgias, back pain and gait problem. Negative for joint swelling, myalgias, neck pain and neck stiffness.  Skin: Negative.   Allergic/Immunologic: Negative.   Neurological:  Positive for tingling, numbness (bilateral thighs) and paresthesias. Negative for dizziness, tremors, seizures, syncope, facial asymmetry, speech difficulty, weakness, light-headedness and headaches.  Hematological: Negative.   Psychiatric/Behavioral:  Negative for confusion, hallucinations, sleep disturbance and suicidal ideas.   All other systems reviewed and are negative.        Observations/Objective: No vital signs or physical exam, this was a virtual health encounter.  Pt alert and oriented, answers all questions appropriately, and able to speak in full sentences.    Assessment  and Plan: Kenza was seen today for back pain.  Diagnoses and all orders for this visit:  Degenerative disc disease, lumbar Bilateral sciatica Worsening over the last few days. Has appointment with neurosurgery scheduled. Will burst with steroids to see if beneficial. No red flags present concerning for Cauda Equina Syndrome. Pt aware of red flags which require emergent evaluation and treatment. Keep appointment with neurosurgery.  -     predniSONE (DELTASONE) 20 MG tablet; Take 2 tablets (40 mg total) by mouth daily with breakfast for 5 days. 2 po daily for 5 days     Follow Up Instructions: Return if symptoms worsen or fail to improve.    I discussed the assessment and treatment plan with the patient. The patient was provided an opportunity to ask questions and all were answered. The patient agreed with the plan and demonstrated an understanding of the instructions.   The patient was advised to call back or seek an in-person evaluation if the symptoms worsen or if the condition fails to improve as anticipated.  The above assessment and management plan was discussed with the patient. The patient verbalized understanding of and has agreed to the management plan. Patient is aware to  call the clinic if they develop any new symptoms or if symptoms persist or worsen. Patient is aware when to return to the clinic for a follow-up visit. Patient educated on when it is appropriate to go to the emergency department.    I provided 12 minutes of time during this MyChart Video encounter.   Monia Pouch, FNP-C [facility] [address] [place], [place] [address] [phone] 08/14/2022

## 2022-08-18 DIAGNOSIS — R200 Anesthesia of skin: Secondary | ICD-10-CM | POA: Diagnosis not present

## 2022-08-18 DIAGNOSIS — Z6836 Body mass index (BMI) 36.0-36.9, adult: Secondary | ICD-10-CM | POA: Diagnosis not present

## 2022-08-18 DIAGNOSIS — G8929 Other chronic pain: Secondary | ICD-10-CM | POA: Diagnosis not present

## 2022-08-18 DIAGNOSIS — M5442 Lumbago with sciatica, left side: Secondary | ICD-10-CM | POA: Diagnosis not present

## 2022-08-20 ENCOUNTER — Telehealth: Payer: Self-pay | Admitting: Family Medicine

## 2022-08-20 NOTE — Telephone Encounter (Signed)
Pt calling to check status of FMLA paperwork. Explained to her that PCP is working on it but that he has been on Christmas vacation. Pt wanted to know if Monia Pouch could fill it out since she saw her last for back pain. Explained to pt that it needed to be her PCP.  Pt then asked how Dr Dettinger became her PCP? Pt says she is fine with Dr Dettinger being her PCP but says she was seeing Marjorie Smolder and then requested to switch to [facility] which was approved but now she's with Dettinger.

## 2022-08-21 NOTE — Telephone Encounter (Signed)
Closing encounter, pt aware FMLA ppw has been completed, faxed in and copy mailed to her.

## 2022-08-21 NOTE — Telephone Encounter (Signed)
Patient calling again about FMLA paperwork. Wants to know if she needs to get Dr Arnoldo Morale to fill it out if Dr Dettinger is having a hard time doing it?

## 2022-09-12 ENCOUNTER — Emergency Department (HOSPITAL_COMMUNITY): Payer: Commercial Managed Care - PPO

## 2022-09-12 ENCOUNTER — Other Ambulatory Visit: Payer: Self-pay

## 2022-09-12 ENCOUNTER — Emergency Department (HOSPITAL_COMMUNITY)
Admission: EM | Admit: 2022-09-12 | Discharge: 2022-09-12 | Disposition: A | Discharge status: Home / Self Care | Payer: Commercial Managed Care - PPO | Attending: Emergency Medicine | Admitting: Emergency Medicine

## 2022-09-12 ENCOUNTER — Encounter (HOSPITAL_COMMUNITY): Payer: Self-pay

## 2022-09-12 DIAGNOSIS — R109 Unspecified abdominal pain: Secondary | ICD-10-CM | POA: Diagnosis not present

## 2022-09-12 DIAGNOSIS — R072 Precordial pain: Secondary | ICD-10-CM | POA: Insufficient documentation

## 2022-09-12 DIAGNOSIS — R0789 Other chest pain: Secondary | ICD-10-CM | POA: Diagnosis not present

## 2022-09-12 DIAGNOSIS — E039 Hypothyroidism, unspecified: Secondary | ICD-10-CM | POA: Insufficient documentation

## 2022-09-12 DIAGNOSIS — R079 Chest pain, unspecified: Secondary | ICD-10-CM | POA: Diagnosis not present

## 2022-09-12 DIAGNOSIS — Z79890 Hormone replacement therapy (postmenopausal): Secondary | ICD-10-CM | POA: Diagnosis not present

## 2022-09-12 LAB — BASIC METABOLIC PANEL
Anion gap: 9 (ref 5–15)
BUN: 8 mg/dL (ref 6–20)
CO2: 29 mmol/L (ref 22–32)
Calcium: 9.1 mg/dL (ref 8.9–10.3)
Chloride: 101 mmol/L (ref 98–111)
Creatinine, Ser: 0.79 mg/dL (ref 0.44–1.00)
GFR, Estimated: >60 mL/min (ref >60)
Glucose, Bld: 107 mg/dL — ABNORMAL HIGH (ref 70–99)
Potassium: 4 mmol/L (ref 3.5–5.1)
Sodium: 139 mmol/L (ref 135–145)

## 2022-09-12 LAB — CBC
HCT: 38.6 % (ref 36.0–46.0)
Hemoglobin: 12.7 g/dL (ref 12.0–15.0)
MCH: 27.7 pg (ref 26.0–34.0)
MCHC: 32.9 g/dL (ref 30.0–36.0)
MCV: 84.3 fL (ref 80.0–100.0)
Platelets: 316 10*3/uL (ref 150–400)
RBC: 4.58 MIL/uL (ref 3.87–5.11)
RDW: 14 % (ref 11.5–15.5)
WBC: 5.9 10*3/uL (ref 4.0–10.5)
nRBC: 0 % (ref 0.0–0.2)

## 2022-09-12 LAB — I-STAT BETA HCG BLOOD, ED (MC, WL, AP ONLY): I-stat hCG, quantitative: 8.9 m[IU]/mL — ABNORMAL HIGH (ref <5)

## 2022-09-12 LAB — TROPONIN I (HIGH SENSITIVITY): Troponin I (High Sensitivity): 3 ng/L (ref <18)

## 2022-09-12 LAB — TSH: TSH: 47.068 u[IU]/mL — ABNORMAL HIGH (ref 0.350–4.500)

## 2022-09-12 MED ORDER — ASPIRIN 81 MG PO CHEW
324.0000 mg | CHEWABLE_TABLET | Freq: Once | ORAL | Status: AC
  Administered 2022-09-12: 324 mg via ORAL
  Filled 2022-09-12: qty 4

## 2022-09-12 NOTE — Discharge Instructions (Addendum)
You were evaluated today for chest pain and right sided abdominal pain. As discussed, you are choosing to leave prior to the CT scan. If your right sided pain worsens or your chest pain worsens, please return to the emergency department. Your initial troponin and EKG do not show signs of a heart attack at this time. Please follow up as needed with your primary care provider.

## 2022-09-12 NOTE — ED Provider Notes (Signed)
[facility] Provider Note   CSN: [id number] Arrival date & time: 09/12/22  0501     History  Chief Complaint  Patient presents with   Chest Pain    Donna Lynch is a 47 y.o. female.  Patient presents to the emergency department complaining of substernal chest pain that is described as feeling "tight."  She states that pain is moderate in severity. The pain has been intermittent over the course of 2 weeks. She believes that she was having PVC's last week and cut out extra caffeine. She also endorses mild shortness of breath and right sided flank pain. She denies nausea, vomiting, dysuria.  Past medical history significant for hypothyroidism on 150 mcg of Synthroid, hyperlipidemia  HPI     Home Medications Prior to Admission medications   Medication Sig Start Date End Date Taking? Authorizing Provider  baclofen (LIORESAL) 10 MG tablet Take 1 tablet (10 mg total) by mouth at bedtime as needed for muscle spasms. 07/24/22   Dettinger, Fransisca Kaufmann, MD  Cholecalciferol (VITAMIN D) 50 MCG (2000 UT) tablet Take 2,000 Units by mouth daily.    [provider]  ezetimibe (ZETIA) 10 MG tablet Take 1 tablet (10 mg total) by mouth daily. 07/30/22   Dettinger, Fransisca Kaufmann, MD  phentermine 37.5 MG capsule Take 37.5 mg by mouth every morning.    [provider]  SYNTHROID 150 MCG tablet Take 1 tablet (150 mcg total) by mouth daily before breakfast. 07/24/22   Dettinger, Fransisca Kaufmann, MD      Allergies    Other and Penicillins    Review of Systems   Review of Systems  Constitutional:  Negative for fever.  Respiratory:  Positive for shortness of breath.   Cardiovascular:  Positive for chest pain.  Gastrointestinal:  Positive for abdominal pain. Negative for nausea and vomiting.  Genitourinary:  Positive for flank pain. Negative for dysuria.    Physical Exam Updated Vital Signs BP (!) 128/94 (BP Location: Right Arm)   Pulse 78   Temp  98.4 F (36.9 C) (Oral)   Resp 16   Ht '5\' 1"'$  (1.549 m)   Wt 87.5 kg   SpO2 100%   BMI 36.45 kg/m  Physical Exam Vitals and nursing note reviewed.  Constitutional:      General: She is not in acute distress.    Appearance: She is well-developed.  HENT:     Head: Normocephalic and atraumatic.  Eyes:     Conjunctiva/sclera: Conjunctivae normal.  Cardiovascular:     Rate and Rhythm: Normal rate and regular rhythm.     Heart sounds: No murmur heard. Pulmonary:     Effort: Pulmonary effort is normal. No respiratory distress.     Breath sounds: Normal breath sounds.  Chest:     Chest wall: No tenderness.  Abdominal:     Palpations: Abdomen is soft.     Tenderness: There is no abdominal tenderness. There is right CVA tenderness (Mild).  Musculoskeletal:        General: No swelling.     Cervical back: Neck supple.  Skin:    General: Skin is warm and dry.     Capillary Refill: Capillary refill takes less than 2 seconds.  Neurological:     Mental Status: She is alert.  Psychiatric:        Mood and Affect: Mood normal.     ED Results / Procedures / Treatments   Labs (all labs ordered are  listed, but only abnormal results are displayed) Labs Reviewed  TSH - Abnormal; Notable for the following components:      Result Value   TSH 47.068 (*)    All other components within normal limits  BASIC METABOLIC PANEL - Abnormal; Notable for the following components:   Glucose, Bld 107 (*)    All other components within normal limits  I-STAT BETA HCG BLOOD, ED ([facility], [facility], [facility] ONLY) - Abnormal; Notable for the following components:   I-stat hCG, quantitative 8.9 (*)    All other components within normal limits  CBC  POC URINE PREG, ED  TROPONIN I (HIGH SENSITIVITY)  TROPONIN I (HIGH SENSITIVITY)    EKG EKG Interpretation  Date/Time:  Saturday September 12 2022 04:50:42 EST Ventricular Rate:  92 PR Interval:  148 QRS Duration: 80 QT Interval:  364 QTC Calculation: 450 R  Axis:   37 Text Interpretation: Normal sinus rhythm Cannot rule out Anterior infarct , age undetermined Abnormal ECG No significant change since last tracing Confirmed by Deno Etienne [phone]) on 09/12/2022 8:55:12 AM  Radiology DG Chest 2 View  Result Date: 09/12/2022 CLINICAL DATA:  47 year old female with history of chest pain for the past 2 weeks. EXAM: CHEST - 2 VIEW COMPARISON:  Chest x-ray 01/24/2022. FINDINGS: Lung volumes are normal. No consolidative airspace disease. No pleural effusions. No pneumothorax. No pulmonary nodule or mass noted. Pulmonary vasculature and the cardiomediastinal silhouette are within normal limits. Orthopedic fixation hardware in the lower cervical spine incidentally noted. IMPRESSION: No radiographic evidence of acute cardiopulmonary disease. Electronically Signed   By: Vinnie Langton M.D.   On: 09/12/2022 06:27    Procedures Procedures    Medications Ordered in ED Medications  aspirin chewable tablet 324 mg (324 mg Oral Given 09/12/22 0516)    ED Course/ Medical Decision Making/ A&P             HEART Score: 2                Medical Decision Making Amount and/or Complexity of Data Reviewed Radiology: ordered.   This patient presents to the ED for concern of chest pain, this involves an extensive number of treatment options, and is a complaint that carries with it a high risk of complications and morbidity.  The differential diagnosis includes ACS, PE, pneumonia, musculoskeletal pain, and others  Patient also reports right-sided flank pain differential includes hydronephrosis, pyelonephritis, nephrolithiasis, and others   Co morbidities that complicate the patient evaluation  Hypothyroidism   Additional history obtained:   External records from outside source obtained and reviewed including family medicine notes showing visits for degenerative disc disease, dysuria over the past 2 months   Lab Tests:  I Ordered, and personally interpreted  labs.  The pertinent results include: Initial troponin 3, unremarkable BMP, unremarkable CBC, TSH of 47.068 (on chronic Synthroid), beta-hCG 8.9 (patient states she has not been sexually active anytime recently)   Imaging Studies ordered:  I ordered imaging studies including chest x-ray I independently visualized and interpreted imaging which showed no acute findings I ordered a CT renal stone study but the patient decided against the study decided to leave instead I agree with the radiologist interpretation   Cardiac Monitoring: / EKG:  The patient was maintained on a cardiac monitor.  I personally viewed and interpreted the cardiac monitored which showed an underlying rhythm of: Sinus rhythm  Problem List / ED Course / Critical interventions / Medication management   I ordered medication including  aspirin  Reevaluation of the patient after these medicines showed that the patient stayed the same I have reviewed the patients home medicines and have made adjustments as needed   Test / Admission - Considered:  Initial troponin negative with a nonischemic EKG.  Doubt ACS at this time.  Patient's history seems less concerning for ACS with intermittent chest pain over the past few weeks.  No tachycardia or shortness of breath to suggest PE at this time.  No pneumonia on chest x-ray.  I went to discuss a possible waiver as radiology was wanting a urine pregnancy test prior to performing CT scan.  Patient states she would like to go home at this time.  I went over current results with the patient.  Patient does not want to wait for second troponin.  This does seem reasonable based on patient's low heart score and initial negative troponin.  Low clinical suspicion at this time of emergent acute intra-abdominal pathology.  Very mild right CVA tenderness.  Patient given strict return precautions including worsening chest pain, shortness of breath        Final Clinical Impression(s) / ED  Diagnoses Final diagnoses:  Chest pain, unspecified type  Abdominal pain, unspecified abdominal location    Rx / DC Orders ED Discharge Orders     None         Ronny Bacon 09/12/22 [place], DO 09/12/22 1043

## 2022-09-12 NOTE — ED Provider Triage Note (Signed)
Emergency Medicine Provider Triage Evaluation Note  DEZIREA MCCOLLISTER , a 47 y.o. female  was evaluated in triage.  Pt complains of intermittent chest pain x 1 week that worsened tonight. Describes pain as dull, aching and central which is nonradiating. Feels as though she can not catch her breath. Symptoms not aggravated by exertions, but she has had some sweats and chills. No associated fever, hemoptysis, leg swelling, vomiting. No prior hx of ACS.  Review of Systems  Positive: As above Negative: As above  Physical Exam  BP (!) 143/103 (BP Location: Right Arm)   Pulse 87   Temp 98.5 F (36.9 C) (Oral)   Resp 15   SpO2 98%  Gen:   Awake, no distress   Resp:  Normal effort. Lungs CTAB. MSK:   Moves extremities without difficulty  Other:  No pitting edema BLE  Medical Decision Making  Medically screening exam initiated at 5:08 AM.  Appropriate orders placed.  ABIGAILE ROSSIE was informed that the remainder of the evaluation will be completed by another provider, this initial triage assessment does not replace that evaluation, and the importance of remaining in the ED until their evaluation is complete.  Nonspecific chest pain - work up started.   Antonietta Breach, PA-C 09/12/22 [phone]

## 2022-09-12 NOTE — ED Notes (Signed)
Pt states that she is ready to go home, pt verbalized understanding d/c and follow up, pt from dpt.

## 2022-09-12 NOTE — ED Triage Notes (Signed)
Patient reports central chest pain onset last night with mild SOB and intermittent palpitations , she adds right flank pain this morning . No emesis or diaphoresis .

## 2022-09-14 ENCOUNTER — Telehealth: Payer: Self-pay

## 2022-09-14 NOTE — Telephone Encounter (Signed)
Transition Care Management Unsuccessful Follow-up Telephone Call  Date of discharge and from where:  [facility] 09/12/2022  Attempts:  1st Attempt  Reason for unsuccessful TCM follow-up call:  Unable to leave message Juanda Crumble, [facility] Dial [phone]

## 2022-09-15 NOTE — Telephone Encounter (Signed)
Transition Care Management Follow-up Telephone Call Date of discharge and from where: [facility] 09/12/2022 How have you been since you were released from the hospital? better Any questions or concerns? No  Items Reviewed: Did the pt receive and understand the discharge instructions provided? Yes  Medications obtained and verified? Yes  Other? No  Any new allergies since your discharge? No  Dietary orders reviewed? Yes Do you have support at home? Yes   Home Care and Equipment/Supplies: Were home health services ordered? no If so, what is the name of the agency? N/a  Has the agency set up a time to come to the patient's home? no Were any new equipment or medical supplies ordered?  No What is the name of the medical supply agency? N/a Were you able to get the supplies/equipment? no Do you have any questions related to the use of the equipment or supplies? No  Functional Questionnaire: (I = Independent and D = Dependent) ADLs: I  Bathing/Dressing- I  Meal Prep- I  Eating- I  Maintaining continence- I  Transferring/Ambulation- I  Managing Meds- I  Follow up appointments reviewed:  PCP Hospital f/u appt confirmed? No  no avail appt times. Sent message to staff to schedule [facility] Hospital f/u appt confirmed? No  Are transportation arrangements needed? No  If their condition worsens, is the pt aware to call PCP or go to the Emergency Dept.? Yes Was the patient provided with contact information for the PCP's office or ED? Yes Was to pt encouraged to call back with questions or concerns? Yes Juanda Crumble, LPN [facility] Direct Dial [phone]

## 2022-09-17 ENCOUNTER — Encounter: Payer: Self-pay | Admitting: Family Medicine

## 2022-09-17 ENCOUNTER — Ambulatory Visit (INDEPENDENT_AMBULATORY_CARE_PROVIDER_SITE_OTHER): Payer: Commercial Managed Care - PPO | Admitting: Family Medicine

## 2022-09-17 VITALS — BP 129/83 | HR 59 | Ht 61.0 in | Wt 197.0 lb

## 2022-09-17 DIAGNOSIS — E039 Hypothyroidism, unspecified: Secondary | ICD-10-CM | POA: Diagnosis not present

## 2022-09-17 DIAGNOSIS — R002 Palpitations: Secondary | ICD-10-CM

## 2022-09-17 MED ORDER — THYROID 60 MG PO TABS: 60.0000 mg | ORAL_TABLET | Freq: Every day | ORAL | 1 refills | Status: DC

## 2022-09-17 NOTE — Progress Notes (Signed)
BP 129/83   Pulse (!) 59   Ht '5\' 1"'$  (1.549 m)   Wt 197 lb (89.4 kg)   SpO2 98%   BMI 37.22 kg/m    Subjective:   Patient ID: Donna Lynch, female    DOB: June 02, 1976, 47 y.o.   MRN: [id number]  HPI: Donna Lynch is a 47 y.o. female presenting on 09/17/2022 for ER follow up and Chest Pain (Constant.has not let up since being in ER)   HPI ER follow-up for chest pain Patient is coming in today for ER follow-up for chest pain.  She was in the emergency department on 09/12/2022 which was 4 days ago.  She was having chest pain and palpitations and tightness that is been going on for some time, it was going on for a week for she was seen in the ER and then she has been having it since then.  She did have the blood work and testing.  The biggest thing was her thyroid was significantly off because she had not been taking her levothyroxine.  She says a lot of her stress and anxiety comes from workplace and hostile environment at workplace.  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  She says she also had a son that she gave up for adoption but then died at 47 years of age and that causes her a lot of stress and trauma because of music that was associated with that time.  Relevant past medical, surgical, family and social history reviewed and updated as indicated. Interim medical history since our last visit reviewed. Allergies and medications reviewed and updated.  Review of Systems  Constitutional:  Negative for chills and fever.  Eyes:  Negative for redness and visual disturbance.  Respiratory:  Negative for chest tightness and shortness of breath.   Cardiovascular:  Positive for chest pain and palpitations. Negative for leg swelling.  Genitourinary:  Negative for difficulty urinating and dysuria.  Musculoskeletal:  Negative for back pain and gait problem.  Skin:  Negative for rash.  Neurological:  Negative for light-headedness and headaches.  Psychiatric/Behavioral:  Positive  for dysphoric mood. Negative for agitation, behavioral problems, self-injury, sleep disturbance and suicidal ideas. The patient is nervous/anxious.   All other systems reviewed and are negative.   Per HPI unless specifically indicated above   Allergies as of 09/17/2022       Reactions   Other Shortness Of Breath   Avoids ghost peppers   Penicillins         Medication List        Accurate as of September 17, 2022 10:00 AM. If you have any questions, ask your nurse or doctor.          STOP taking these medications    baclofen 10 MG tablet Commonly known as: LIORESAL Stopped by: Worthy Rancher, MD   ezetimibe 10 MG tablet Commonly known as: Zetia Stopped by: Worthy Rancher, MD   phentermine 37.5 MG capsule Stopped by: Worthy Rancher, MD   Synthroid 150 MCG tablet Generic drug: levothyroxine Stopped by: Worthy Rancher, MD   Vitamin D 50 MCG (2000 UT) tablet Stopped by: Fransisca Kaufmann Meili Kleckley, MD       TAKE these medications    thyroid 60 MG tablet Commonly known as: Armour Thyroid Take 1 tablet (60 mg total) by mouth daily before breakfast. Started by: Worthy Rancher, MD         Objective:   BP  129/83   Pulse (!) 59   Ht '5\' 1"'$  (1.549 m)   Wt 197 lb (89.4 kg)   SpO2 98%   BMI 37.22 kg/m   Wt Readings from Last 3 Encounters:  09/17/22 197 lb (89.4 kg)  09/12/22 192 lb 14.4 oz (87.5 kg)  07/24/22 193 lb (87.5 kg)    Physical Exam Vitals and nursing note reviewed.  Constitutional:      General: She is not in acute distress.    Appearance: She is well-developed. She is not diaphoretic.  Eyes:     Conjunctiva/sclera: Conjunctivae normal.  Cardiovascular:     Rate and Rhythm: Normal rate and regular rhythm.     Heart sounds: Normal heart sounds. No murmur heard. Pulmonary:     Effort: Pulmonary effort is normal. No respiratory distress.     Breath sounds: Normal breath sounds. No wheezing.  Musculoskeletal:        General: No  swelling or tenderness. Normal range of motion.  Skin:    General: Skin is warm and dry.     Findings: No rash.  Neurological:     Mental Status: She is alert and oriented to person, place, and time.     Coordination: Coordination normal.  Psychiatric:        Mood and Affect: Mood is anxious. Mood is not depressed.        Behavior: Behavior normal.        Thought Content: Thought content does not include suicidal ideation. Thought content does not include suicidal plan.       Assessment & Plan:   Problem List Items Addressed This Visit       Endocrine   Hypothyroidism - Primary   Relevant Medications   thyroid (ARMOUR THYROID) 60 MG tablet   Other Visit Diagnoses     Palpitations       Relevant Orders   CBC with Differential/Platelet   CMP14+EGFR     Concern the patient might have something like PTSD related to some of her anxiety or another type of anxiety syndrome.  Discussed possibly going to psychiatry versus treatment for it and she has declined.  Follow up plan: Return in about 8 weeks (around 11/12/2022), or if symptoms worsen or fail to improve, for Thyroid and palpitations and anxiety recheck.  Counseling provided for all of the vaccine components Orders Placed This Encounter  Procedures   CBC with Differential/Platelet   Bucyrus Mattox Schorr, MD [facility] 09/17/2022, 10:00 AM

## 2022-09-30 ENCOUNTER — Telehealth (INDEPENDENT_AMBULATORY_CARE_PROVIDER_SITE_OTHER): Payer: Commercial Managed Care - PPO | Admitting: Family Medicine

## 2022-09-30 ENCOUNTER — Encounter: Payer: Self-pay | Admitting: Family Medicine

## 2022-09-30 DIAGNOSIS — M5030 Other cervical disc degeneration, unspecified cervical region: Secondary | ICD-10-CM | POA: Diagnosis not present

## 2022-09-30 DIAGNOSIS — J358 Other chronic diseases of tonsils and adenoids: Secondary | ICD-10-CM | POA: Diagnosis not present

## 2022-09-30 DIAGNOSIS — M48061 Spinal stenosis, lumbar region without neurogenic claudication: Secondary | ICD-10-CM | POA: Diagnosis not present

## 2022-09-30 DIAGNOSIS — J351 Hypertrophy of tonsils: Secondary | ICD-10-CM

## 2022-09-30 DIAGNOSIS — M5442 Lumbago with sciatica, left side: Secondary | ICD-10-CM | POA: Diagnosis not present

## 2022-09-30 DIAGNOSIS — M4722 Other spondylosis with radiculopathy, cervical region: Secondary | ICD-10-CM | POA: Diagnosis not present

## 2022-09-30 DIAGNOSIS — J029 Acute pharyngitis, unspecified: Secondary | ICD-10-CM

## 2022-09-30 MED ORDER — CEFDINIR 300 MG PO CAPS: 300.0000 mg | ORAL_CAPSULE | Freq: Two times a day (BID) | ORAL | 0 refills | Status: DC

## 2022-09-30 NOTE — Progress Notes (Signed)
Virtual Visit via MyChart Video Note Due to COVID-19 pandemic this visit was conducted virtually. This visit type was conducted due to national recommendations for restrictions regarding the COVID-19 Pandemic (e.g. social distancing, sheltering in place) in an effort to limit this patient's exposure and mitigate transmission in our community. All issues noted in this document were discussed and addressed.  A physical exam was not performed with this format.   I connected with Donna Lynch on 09/30/2022 at 1145 by MyChart Video and verified that I am speaking with the correct person using two identifiers. Donna Lynch is currently located at home and patient is currently with them during visit. The provider, Monia Pouch, FNP is located in their office at time of visit.  I discussed the limitations, risks, security and privacy concerns of performing an evaluation and management service by virtual visit and the availability of in person appointments. I also discussed with the patient that there may be a patient responsible charge related to this service. The patient expressed understanding and agreed to proceed.  Subjective:  Patient ID: Donna Lynch, female    DOB: May 23, 1976, 47 y.o.   MRN: [id number]  Chief Complaint:  Sore Throat   HPI: Donna Lynch is a 47 y.o. female presenting on 09/30/2022 for Sore Throat   Sore Throat  This is a recurrent problem. Episode onset: 2.5 weeks ago. Had a virtual visit and was treated with albuterol, tessalon, and prednisone. The problem has been gradually worsening. The pain is moderate. Associated symptoms include swollen glands and trouble swallowing. Pertinent negatives include no abdominal pain, congestion, coughing, diarrhea, drooling, ear discharge, ear pain, headaches, hoarse voice, plugged ear sensation, neck pain, shortness of breath, stridor or vomiting. Treatments tried: tessalon, prednisone, albuterol inhaler. The treatment provided no  relief.     Relevant past medical, surgical, family, and social history reviewed and updated as indicated.  Allergies and medications reviewed and updated.   Past Medical History:  Diagnosis Date   Anemia    Hypothyroid 2020   Hypothyroidism 07/29/2020   Thyroid disease     Past Surgical History:  Procedure Laterality Date   CHOLECYSTECTOMY     NECK SURGERY     TUBAL LIGATION Bilateral 2011    Social History   Socioeconomic History   Marital status: Legally Separated    Spouse name: Not on file   Number of children: Not on file   Years of education: Not on file   Highest education level: Not on file  Occupational History   Not on file  Tobacco Use   Smoking status: Former    Types: Cigarettes    Quit date: 08/17/2010    Years since quitting: 12.1   Smokeless tobacco: Never  Vaping Use   Vaping Use: Never used  Substance and Sexual Activity   Alcohol use: Yes    Comment: socially   Drug use: Never   Sexual activity: Not Currently    Birth control/protection: None  Other Topics Concern   Not on file  Social History Narrative   Not on file   Social Determinants of Health   Financial Resource Strain: Not on file  Food Insecurity: Not on file  Transportation Needs: Not on file  Physical Activity: Not on file  Stress: Not on file  Social Connections: Not on file  Intimate Partner Violence: Not on file    Outpatient Encounter Medications as of 09/30/2022  Medication Sig   cefdinir (OMNICEF) 300 MG capsule  Take 1 capsule (300 mg total) by mouth 2 (two) times daily. 1 po BID   thyroid (ARMOUR THYROID) 60 MG tablet Take 1 tablet (60 mg total) by mouth daily before breakfast.   No facility-administered encounter medications on file as of 09/30/2022.    Allergies  Allergen Reactions   Other Shortness Of Breath    Avoids ghost peppers   Penicillins     Review of Systems  Constitutional:  Positive for appetite change and chills. Negative for activity  change, diaphoresis, fatigue, fever and unexpected weight change.  HENT:  Positive for sore throat, trouble swallowing and voice change. Negative for congestion, dental problem, drooling, ear discharge, ear pain, facial swelling, hearing loss, hoarse voice, mouth sores, nosebleeds, postnasal drip, rhinorrhea, sinus pressure, sinus pain, sneezing and tinnitus.   Eyes:  Negative for photophobia and visual disturbance.  Respiratory:  Negative for cough, shortness of breath and stridor.   Cardiovascular:  Negative for chest pain, palpitations and leg swelling.  Gastrointestinal:  Negative for abdominal pain, diarrhea and vomiting.  Endocrine: Negative for polydipsia, polyphagia and polyuria.  Genitourinary:  Negative for decreased urine volume and difficulty urinating.  Musculoskeletal:  Negative for neck pain.  Neurological:  Negative for dizziness, tremors, seizures, syncope, facial asymmetry, speech difficulty, weakness, light-headedness, numbness and headaches.  All other systems reviewed and are negative.        Observations/Objective: No vital signs or physical exam, this was a virtual health encounter.  Pt alert and oriented, answers all questions appropriately, and able to speak in full sentences.    Assessment and Plan: Fynnlee was seen today for sore throat.  Diagnoses and all orders for this visit:  Sore throat Swelling of tonsil Tonsillar exudate Ongoing sore throat with tonsil swelling and exudates despite conservative management of symptoms. Ongoing for over 2 weeks now. Completed course of prednisone and tessalon. Has an albuterol inhaler which has not been beneficial for symptom relief. At this point, will place on antibiotic therapy as this is likely bacterial due to continued and worsening symptoms. Pt to report new, worsening, or persistent symptoms.  -     cefdinir (OMNICEF) 300 MG capsule; Take 1 capsule (300 mg total) by mouth 2 (two) times daily. 1 po  BID     Follow Up Instructions: Return if symptoms worsen or fail to improve.    I discussed the assessment and treatment plan with the patient. The patient was provided an opportunity to ask questions and all were answered. The patient agreed with the plan and demonstrated an understanding of the instructions.   The patient was advised to call back or seek an in-person evaluation if the symptoms worsen or if the condition fails to improve as anticipated.  The above assessment and management plan was discussed with the patient. The patient verbalized understanding of and has agreed to the management plan. Patient is aware to call the clinic if they develop any new symptoms or if symptoms persist or worsen. Patient is aware when to return to the clinic for a follow-up visit. Patient educated on when it is appropriate to go to the emergency department.    I provided 15 minutes of time during this MyChart Video encounter.   Monia Pouch, FNP-C [facility] [address] [place], [place] [address] [phone] 09/30/2022

## 2022-10-01 DIAGNOSIS — H43812 Vitreous degeneration, left eye: Secondary | ICD-10-CM | POA: Diagnosis not present

## 2022-10-01 DIAGNOSIS — H43811 Vitreous degeneration, right eye: Secondary | ICD-10-CM | POA: Diagnosis not present

## 2022-11-05 ENCOUNTER — Ambulatory Visit (INDEPENDENT_AMBULATORY_CARE_PROVIDER_SITE_OTHER): Payer: Commercial Managed Care - PPO | Admitting: Family Medicine

## 2022-11-05 ENCOUNTER — Encounter: Payer: Self-pay | Admitting: Family Medicine

## 2022-11-05 VITALS — BP 130/91 | HR 80 | Ht 61.0 in | Wt 197.0 lb

## 2022-11-05 DIAGNOSIS — E6609 Other obesity due to excess calories: Secondary | ICD-10-CM

## 2022-11-05 DIAGNOSIS — R7309 Other abnormal glucose: Secondary | ICD-10-CM | POA: Diagnosis not present

## 2022-11-05 DIAGNOSIS — Z6837 Body mass index (BMI) 37.0-37.9, adult: Secondary | ICD-10-CM

## 2022-11-05 DIAGNOSIS — E039 Hypothyroidism, unspecified: Secondary | ICD-10-CM | POA: Diagnosis not present

## 2022-11-05 LAB — BAYER DCA HB A1C WAIVED: HB A1C (BAYER DCA - WAIVED): 5.5 % (ref 4.8–5.6)

## 2022-11-05 NOTE — Progress Notes (Signed)
BP (!) 130/91   Pulse 80   Ht 5\' 1"  (1.549 m)   Wt 197 lb (89.4 kg)   SpO2 98%   BMI 37.22 kg/m    Subjective:   Patient ID: Donna Lynch, female    DOB: August 27, 1975, 47 y.o.   MRN: [id number]  HPI: Donna Lynch is a 47 y.o. female presenting on 11/05/2022 for Medical Management of Chronic Issues, Hypothyroidism, Anxiety, and Obesity (Wants to lose weight)   HPI Hypothyroidism recheck Patient is coming in for thyroid recheck today as well. They deny any issues with hair changes or heat or cold problems or diarrhea or constipation. They deny any chest pain or palpitations. They are currently on Armour 60 mg  Elevated glucose Patient had elevated glucose on last draw, will do an A1c today.  Obesity Patient continues to fight obesity more weight since even January.  Relevant past medical, surgical, family and social history reviewed and updated as indicated. Interim medical history since our last visit reviewed. Allergies and medications reviewed and updated.  Review of Systems  Constitutional:  Positive for unexpected weight change. Negative for chills and fever.  Eyes:  Negative for visual disturbance.  Respiratory:  Negative for chest tightness and shortness of breath.   Cardiovascular:  Negative for chest pain and leg swelling.  Musculoskeletal:  Negative for back pain and gait problem.  Skin:  Negative for rash.  Neurological:  Negative for dizziness, light-headedness and headaches.  Psychiatric/Behavioral:  Positive for dysphoric mood. Negative for agitation, behavioral problems, self-injury, sleep disturbance and suicidal ideas. The patient is nervous/anxious.   All other systems reviewed and are negative.   Per HPI unless specifically indicated above   Allergies as of 11/05/2022       Reactions   Other Shortness Of Breath   Avoids ghost peppers   Penicillins         Medication List        Accurate as of November 05, 2022 11:13 AM. If you have any  questions, ask your nurse or doctor.          STOP taking these medications    cefdinir 300 MG capsule Commonly known as: OMNICEF Stopped by: Fransisca Kaufmann Amiliah Campisi, MD       TAKE these medications    thyroid 60 MG tablet Commonly known as: Armour Thyroid Take 1 tablet (60 mg total) by mouth daily before breakfast.         Objective:   BP (!) 130/91   Pulse 80   Ht 5\' 1"  (1.549 m)   Wt 197 lb (89.4 kg)   SpO2 98%   BMI 37.22 kg/m   Wt Readings from Last 3 Encounters:  11/05/22 197 lb (89.4 kg)  09/17/22 197 lb (89.4 kg)  09/12/22 192 lb 14.4 oz (87.5 kg)    Physical Exam Vitals and nursing note reviewed.  Constitutional:      General: She is not in acute distress.    Appearance: She is well-developed. She is not diaphoretic.  Eyes:     Conjunctiva/sclera: Conjunctivae normal.  Cardiovascular:     Rate and Rhythm: Normal rate and regular rhythm.     Heart sounds: Normal heart sounds. No murmur heard. Pulmonary:     Effort: Pulmonary effort is normal. No respiratory distress.     Breath sounds: Normal breath sounds. No wheezing.  Skin:    General: Skin is warm and dry.     Findings: No rash.  Neurological:  Mental Status: She is alert and oriented to person, place, and time.     Coordination: Coordination normal.  Psychiatric:        Behavior: Behavior normal.       Assessment & Plan:   Problem List Items Addressed This Visit       Endocrine   Hypothyroidism   Relevant Orders   Amb ref to Medical Nutrition Therapy-MNT   Thyroid Panel With TSH   Other Visit Diagnoses     Elevated glucose    -  Primary   Relevant Orders   Amb ref to Medical Nutrition Therapy-MNT   Bayer Letcher Hb A1c Waived   Class 2 obesity due to excess calories without serious comorbidity with body mass index (BMI) of 37.0 to 37.9 in adult       Relevant Orders   Amb ref to Medical Nutrition Therapy-MNT     Will check A1c and thyroid panel, will refer to healthy  weight and wellness clinic in [place].  Follow up plan: Return if symptoms worsen or fail to improve, for 2 months recheck thyroid.  Counseling provided for all of the vaccine components Orders Placed This Encounter  Procedures   Bayer DCA Hb A1c Waived   Thyroid Panel With TSH   Amb ref to [place], MD [facility] 11/05/2022, 11:13 AM

## 2022-11-06 LAB — THYROID PANEL WITH TSH
Free Thyroxine Index: 1.1 — ABNORMAL LOW (ref 1.2–4.9)
T3 Uptake Ratio: 22 % — ABNORMAL LOW (ref 24–39)
T4, Total: 4.8 ug/dL (ref 4.5–12.0)
TSH: 12.6 u[IU]/mL — ABNORMAL HIGH (ref 0.450–4.500)

## 2022-11-09 ENCOUNTER — Other Ambulatory Visit: Payer: Self-pay | Admitting: Family Medicine

## 2022-11-09 MED ORDER — THYROID 90 MG PO TABS: 90.0000 mg | ORAL_TABLET | Freq: Every day | ORAL | 1 refills | Status: DC

## 2022-12-24 DIAGNOSIS — R030 Elevated blood-pressure reading, without diagnosis of hypertension: Secondary | ICD-10-CM | POA: Diagnosis not present

## 2022-12-24 DIAGNOSIS — Z6835 Body mass index (BMI) 35.0-35.9, adult: Secondary | ICD-10-CM | POA: Diagnosis not present

## 2022-12-24 DIAGNOSIS — W57XXXA Bitten or stung by nonvenomous insect and other nonvenomous arthropods, initial encounter: Secondary | ICD-10-CM | POA: Diagnosis not present

## 2022-12-24 DIAGNOSIS — E669 Obesity, unspecified: Secondary | ICD-10-CM | POA: Diagnosis not present

## 2022-12-24 DIAGNOSIS — R609 Edema, unspecified: Secondary | ICD-10-CM | POA: Diagnosis not present

## 2022-12-31 ENCOUNTER — Encounter: Payer: Self-pay | Admitting: Family Medicine

## 2022-12-31 MED ORDER — EZETIMIBE 10 MG PO TABS: 10.0000 mg | ORAL_TABLET | Freq: Every day | ORAL | 3 refills | Status: DC

## 2023-01-13 ENCOUNTER — Encounter: Payer: Self-pay | Admitting: Family Medicine

## 2023-01-13 ENCOUNTER — Ambulatory Visit (INDEPENDENT_AMBULATORY_CARE_PROVIDER_SITE_OTHER): Payer: Commercial Managed Care - PPO | Admitting: Family Medicine

## 2023-01-13 VITALS — BP 125/87 | HR 68 | Ht 61.0 in | Wt 194.0 lb

## 2023-01-13 DIAGNOSIS — W57XXXA Bitten or stung by nonvenomous insect and other nonvenomous arthropods, initial encounter: Secondary | ICD-10-CM

## 2023-01-13 DIAGNOSIS — E039 Hypothyroidism, unspecified: Secondary | ICD-10-CM

## 2023-01-13 DIAGNOSIS — S80861A Insect bite (nonvenomous), right lower leg, initial encounter: Secondary | ICD-10-CM | POA: Diagnosis not present

## 2023-01-13 DIAGNOSIS — E559 Vitamin D deficiency, unspecified: Secondary | ICD-10-CM

## 2023-01-13 DIAGNOSIS — Z1211 Encounter for screening for malignant neoplasm of colon: Secondary | ICD-10-CM

## 2023-01-13 DIAGNOSIS — E782 Mixed hyperlipidemia: Secondary | ICD-10-CM

## 2023-01-13 MED ORDER — DOXYCYCLINE HYCLATE 100 MG PO TABS: 100.0000 mg | ORAL_TABLET | Freq: Two times a day (BID) | ORAL | 0 refills | Status: DC

## 2023-01-13 NOTE — Progress Notes (Signed)
BP 125/87   Pulse 68   Ht 5\' 1"  (1.549 m)   Wt 194 lb (88 kg)   SpO2 97%   BMI 36.66 kg/m    Subjective:   Patient ID: Donna Lynch, female    DOB: 06/30/1976, 47 y.o.   MRN: [id number]  HPI: Donna Lynch is a 47 y.o. female presenting on 01/13/2023 for Medical Management of Chronic Issues, Hypothyroidism, and Hyperlipidemia   HPI Hypothyroidism recheck Patient is coming in for thyroid recheck today as well. They deny any issues with hair changes or heat or cold problems or diarrhea or constipation. They deny any chest pain or palpitations. They are currently on Armour Thyroid 90 mg  Hyperlipidemia Patient is coming in for recheck of his hyperlipidemia. The patient is currently taking Zetia. They deny any issues with myalgias or history of liver damage from it. They deny any focal numbness or weakness or chest pain.   Vitamin D deficiency recheck Patient is coming in for vitamin D deficiency recheck.  Tick bite lower leg, she said she had a few weeks ago and is still not completely feeling and she has had a history of Lyme disease and she feels like she has inflammatory arthritis like Lyme disease.  She did go to an urgent care but they gave her capsules and made her sick to her stomach and she wants the tablets of doxycycline.  Relevant past medical, surgical, family and social history reviewed and updated as indicated. Interim medical history since our last visit reviewed. Allergies and medications reviewed and updated.  Review of Systems  Constitutional:  Negative for chills and fever.  Eyes:  Negative for visual disturbance.  Respiratory:  Negative for chest tightness and shortness of breath.   Cardiovascular:  Negative for chest pain and leg swelling.  Genitourinary:  Negative for difficulty urinating and dysuria.  Musculoskeletal:  Negative for back pain and gait problem.  Skin:  Positive for rash (Small papule on right lower leg).  Neurological:  Negative for  dizziness, light-headedness and headaches.  Psychiatric/Behavioral:  Negative for agitation and behavioral problems.   All other systems reviewed and are negative.   Per HPI unless specifically indicated above   Allergies as of 01/13/2023       Reactions   Other Shortness Of Breath   Avoids ghost peppers   Penicillins         Medication List        Accurate as of Jan 13, 2023  9:13 AM. If you have any questions, ask your nurse or doctor.          doxycycline 100 MG tablet Commonly known as: VIBRA-TABS Take 1 tablet (100 mg total) by mouth 2 (two) times daily. 1 po bid Started by: Elige Radon Lakaya Tolen, MD   ezetimibe 10 MG tablet Commonly known as: Zetia Take 1 tablet (10 mg total) by mouth daily.   thyroid 90 MG tablet Commonly known as: Armour Thyroid Take 1 tablet (90 mg total) by mouth daily.         Objective:   BP 125/87   Pulse 68   Ht 5\' 1"  (1.549 m)   Wt 194 lb (88 kg)   SpO2 97%   BMI 36.66 kg/m   Wt Readings from Last 3 Encounters:  01/13/23 194 lb (88 kg)  11/05/22 197 lb (89.4 kg)  09/17/22 197 lb (89.4 kg)    Physical Exam Vitals and nursing note reviewed.  Constitutional:  General: She is not in acute distress.    Appearance: She is well-developed. She is not diaphoretic.  Eyes:     Conjunctiva/sclera: Conjunctivae normal.  Cardiovascular:     Rate and Rhythm: Normal rate and regular rhythm.     Heart sounds: Normal heart sounds. No murmur heard. Pulmonary:     Effort: Pulmonary effort is normal. No respiratory distress.     Breath sounds: Normal breath sounds. No wheezing.  Musculoskeletal:        General: No tenderness. Normal range of motion.  Skin:    General: Skin is warm and dry.     Findings: No rash.  Neurological:     Mental Status: She is alert and oriented to person, place, and time.     Coordination: Coordination normal.  Psychiatric:        Behavior: Behavior normal.       Assessment & Plan:    Problem List Items Addressed This Visit       Endocrine   Hypothyroidism - Primary   Relevant Orders   Thyroid Panel With TSH     Other   Mixed hyperlipidemia   Relevant Orders   CMP14+EGFR   Lipid panel   Vitamin D deficiency   Relevant Orders   VITAMIN D 25 Hydroxy (Vit-D Deficiency, Fractures)   Other Visit Diagnoses     Tick bite of right lower leg, initial encounter       Relevant Medications   doxycycline (VIBRA-TABS) 100 MG tablet       Continue current medicine, recheck thyroid levels and cholesterol today.  Gave short course of doxycycline to complement her antibiotic that she started taken for possible Lyme disease. Follow up plan: Return if symptoms worsen or fail to improve, for 2 to 49-month thyroid recheck.  Counseling provided for all of the vaccine components Orders Placed This Encounter  Procedures   Thyroid Panel With TSH   CMP14+EGFR   Lipid panel   VITAMIN D 25 Hydroxy (Vit-D Deficiency, Fractures)    Arville Care, MD [facility] 01/13/2023, 9:13 AM

## 2023-01-13 NOTE — Addendum Note (Signed)
Addended by: Arville Care on: 01/13/2023 09:22 AM   Modules accepted: Orders

## 2023-01-14 LAB — CMP14+EGFR
ALT: 15 IU/L (ref 0–32)
AST: 14 IU/L (ref 0–40)
Albumin/Globulin Ratio: 2 (ref 1.2–2.2)
Albumin: 4.5 g/dL (ref 3.9–4.9)
Alkaline Phosphatase: 101 IU/L (ref 44–121)
BUN/Creatinine Ratio: 17 (ref 9–23)
BUN: 13 mg/dL (ref 6–24)
Bilirubin Total: 0.6 mg/dL (ref 0.0–1.2)
CO2: 25 mmol/L (ref 20–29)
Calcium: 9.4 mg/dL (ref 8.7–10.2)
Chloride: 104 mmol/L (ref 96–106)
Creatinine, Ser: 0.76 mg/dL (ref 0.57–1.00)
Globulin, Total: 2.2 g/dL (ref 1.5–4.5)
Glucose: 101 mg/dL — ABNORMAL HIGH (ref 70–99)
Potassium: 4.7 mmol/L (ref 3.5–5.2)
Sodium: 141 mmol/L (ref 134–144)
Total Protein: 6.7 g/dL (ref 6.0–8.5)
eGFR: 96 mL/min/{1.73_m2} (ref >59)

## 2023-01-14 LAB — VITAMIN D 25 HYDROXY (VIT D DEFICIENCY, FRACTURES): Vit D, 25-Hydroxy: 28.4 ng/mL — ABNORMAL LOW (ref 30.0–100.0)

## 2023-01-14 LAB — THYROID PANEL WITH TSH
Free Thyroxine Index: 1.1 — ABNORMAL LOW (ref 1.2–4.9)
T3 Uptake Ratio: 24 % (ref 24–39)
T4, Total: 4.6 ug/dL (ref 4.5–12.0)
TSH: 14.3 u[IU]/mL — ABNORMAL HIGH (ref 0.450–4.500)

## 2023-01-14 LAB — LIPID PANEL
Chol/HDL Ratio: 4.5 ratio — ABNORMAL HIGH (ref 0.0–4.4)
Cholesterol, Total: 236 mg/dL — ABNORMAL HIGH (ref 100–199)
HDL: 52 mg/dL (ref >39)
LDL Chol Calc (NIH): 170 mg/dL — ABNORMAL HIGH (ref 0–99)
Triglycerides: 83 mg/dL (ref 0–149)
VLDL Cholesterol Cal: 14 mg/dL (ref 5–40)

## 2023-01-15 ENCOUNTER — Telehealth: Payer: Self-pay | Admitting: Family Medicine

## 2023-01-15 ENCOUNTER — Other Ambulatory Visit: Payer: Self-pay | Admitting: Family Medicine

## 2023-01-15 MED ORDER — NEXLETOL 180 MG PO TABS: 180.0000 mg | ORAL_TABLET | Freq: Every day | ORAL | 1 refills | Status: DC

## 2023-01-15 MED ORDER — THYROID 120 MG PO TABS: 120.0000 mg | ORAL_TABLET | Freq: Every day | ORAL | 1 refills | Status: DC

## 2023-01-15 NOTE — Telephone Encounter (Signed)
Both sent to [facility] pharmacy

## 2023-01-18 ENCOUNTER — Telehealth: Payer: Self-pay

## 2023-01-18 NOTE — Telephone Encounter (Signed)
JANAVIA SPIZZIRRI (Key: [id number]) PA Case ID #: [id number] Rx #: [id number] Need Help? Call us at [phone] Status sent iconSent to Plan today Drug Nexletol 180MG  tablets ePA cloud logo Form [employer] ePA Form 2017 NCPDP

## 2023-01-20 ENCOUNTER — Ambulatory Visit: Payer: Self-pay | Admitting: Family Medicine

## 2023-01-28 ENCOUNTER — Other Ambulatory Visit: Payer: Self-pay

## 2023-01-28 DIAGNOSIS — Z1231 Encounter for screening mammogram for malignant neoplasm of breast: Secondary | ICD-10-CM

## 2023-02-10 ENCOUNTER — Inpatient Hospital Stay: Admission: RE | Admit: 2023-02-10 | Payer: Commercial Managed Care - PPO | Source: Ambulatory Visit

## 2023-02-12 ENCOUNTER — Telehealth: Payer: Self-pay | Admitting: Family Medicine

## 2023-02-12 NOTE — Telephone Encounter (Signed)
Received fax asking for additional info for PA. According to the questions, pt must try statin therapy before insurance will approve Nexletol.

## 2023-02-12 NOTE — Telephone Encounter (Signed)
I called patient and explained the process of how FMLA works. Patient states that she has started her period and has not had a period since December. She is complaining with all over body aches, headaches, and cramping. Patient is going to do a mychart video visit to discuss the headache and will follow up

## 2023-02-15 ENCOUNTER — Encounter: Payer: Self-pay | Admitting: Family Medicine

## 2023-02-15 ENCOUNTER — Ambulatory Visit (INDEPENDENT_AMBULATORY_CARE_PROVIDER_SITE_OTHER): Payer: Commercial Managed Care - PPO | Admitting: Family Medicine

## 2023-02-15 VITALS — BP 120/81 | HR 72 | Ht 61.0 in | Wt 195.0 lb

## 2023-02-15 DIAGNOSIS — E782 Mixed hyperlipidemia: Secondary | ICD-10-CM | POA: Diagnosis not present

## 2023-02-15 DIAGNOSIS — E039 Hypothyroidism, unspecified: Secondary | ICD-10-CM

## 2023-02-15 DIAGNOSIS — G43829 Menstrual migraine, not intractable, without status migrainosus: Secondary | ICD-10-CM | POA: Diagnosis not present

## 2023-02-15 MED ORDER — ROSUVASTATIN CALCIUM 5 MG PO TABS
5.0000 mg | ORAL_TABLET | Freq: Every day | ORAL | 3 refills | Status: AC
Start: 2023-02-15 — End: ?

## 2023-02-15 NOTE — Telephone Encounter (Signed)
I understand her concerns about that, unfortunately the insurance company will not pay for it unless she has tried 1 in the past and was hoping that maybe she had tried 1 at least once for sure.  Then we could have said that but if she does want to try to pay for Nexletol out-of-pocket, make sure she uses [employer] and it looks like it is about $231 a month, unfortunately it is a little more pricey because it is still brand-name and they do not have a generic.

## 2023-02-15 NOTE — Progress Notes (Signed)
BP 120/81   Pulse 72   Ht 5\' 1"  (1.549 m)   Wt 195 lb (88.5 kg)   SpO2 97%   BMI 36.84 kg/m    Subjective:   Patient ID: Donna Lynch, female    DOB: [birth year], 47 y.o.   MRN: [id number]  HPI: Donna Lynch is a 47 y.o. female presenting on 02/15/2023 for Headache (H/O migraines. Believes they are coming back) and Dizziness   HPI Migraines Patient is coming in for headaches and migraines that have been coming with her menstrual cycles more recently.  She did have a menstrual cycle unlike she has had for quite some time, we think this is mainly because the thyroid is starting to come under better control.  Hypothyroidism recheck Patient is coming in for thyroid recheck today as well. They deny any issues with hair changes or heat or cold problems or diarrhea or constipation. They deny any chest pain or palpitations. They are currently on Armour Thyroid 120 mg  Hyperlipidemia Patient is coming in for recheck of his hyperlipidemia. The patient is currently taking no medicine, unable to afford the Nexletol and is not currently taking the Zetia.. They deny any issues with myalgias or history of liver damage from it. They deny any focal numbness or weakness or chest pain.   Relevant past medical, surgical, family and social history reviewed and updated as indicated. Interim medical history since our last visit reviewed. Allergies and medications reviewed and updated.  Review of Systems  Constitutional:  Negative for chills and fever.  Eyes:  Negative for visual disturbance.  Respiratory:  Negative for chest tightness and shortness of breath.   Cardiovascular:  Negative for chest pain and leg swelling.  Musculoskeletal:  Negative for back pain and gait problem.  Skin:  Negative for rash.  Neurological:  Positive for headaches. Negative for dizziness and light-headedness.  Psychiatric/Behavioral:  Negative for agitation and behavioral problems.   All other systems reviewed and are  negative.   Per HPI unless specifically indicated above   Allergies as of 02/15/2023       Reactions   Other Shortness Of Breath   Avoids ghost peppers   Penicillins         Medication List        Accurate as of February 15, 2023  3:38 PM. If you have any questions, ask your nurse or doctor.          STOP taking these medications    doxycycline 100 MG tablet Commonly known as: VIBRA-TABS Stopped by: Nils Pyle, MD   ezetimibe 10 MG tablet Commonly known as: Zetia Stopped by: Nils Pyle, MD   Nexletol 180 MG Tabs Generic drug: Bempedoic Acid Stopped by: Elige Radon Deslyn Cavenaugh, MD       TAKE these medications    rosuvastatin 5 MG tablet Commonly known as: Crestor Take 1 tablet (5 mg total) by mouth daily. Started by: Nils Pyle, MD   thyroid 120 MG tablet Commonly known as: Armour Thyroid Take 1 tablet (120 mg total) by mouth daily.         Objective:   BP 120/81   Pulse 72   Ht 5\' 1"  (1.549 m)   Wt 195 lb (88.5 kg)   SpO2 97%   BMI 36.84 kg/m   Wt Readings from Last 3 Encounters:  02/15/23 195 lb (88.5 kg)  01/13/23 194 lb (88 kg)  11/05/22 197 lb (89.4 kg)    Physical  Exam Vitals and nursing note reviewed.  Constitutional:      General: She is not in acute distress.    Appearance: She is well-developed. She is not diaphoretic.  Eyes:     Conjunctiva/sclera: Conjunctivae normal.     Pupils: Pupils are equal, round, and reactive to light.  Cardiovascular:     Rate and Rhythm: Normal rate and regular rhythm.     Heart sounds: Normal heart sounds. No murmur heard. Pulmonary:     Effort: Pulmonary effort is normal. No respiratory distress.     Breath sounds: Normal breath sounds. No wheezing.  Musculoskeletal:        General: No tenderness. Normal range of motion.  Skin:    General: Skin is warm and dry.     Findings: No rash.  Neurological:     Mental Status: She is alert and oriented to person, place, and time.      Coordination: Coordination normal.  Psychiatric:        Behavior: Behavior normal.       Assessment & Plan:   Problem List Items Addressed This Visit       Endocrine   Hypothyroidism   Relevant Orders   TSH     Other   Mixed hyperlipidemia   Relevant Medications   rosuvastatin (CRESTOR) 5 MG tablet   Other Visit Diagnoses     Menstrual migraine without status migrainosus, not intractable    -  Primary   Relevant Medications   rosuvastatin (CRESTOR) 5 MG tablet       Will do FMLA for migraines, she is going to request and get the paperwork to [place] and we can say that she can have 1 to 2 days for headaches or migraines per month.  Will start Crestor.  Will recheck thyroid levels today. Follow up plan: Return in about 2 months (around 04/18/2023) for Thyroid recheck.  Counseling provided for all of the vaccine components Orders Placed This Encounter  Procedures   TSH    Arville Care, MD [facility] 02/15/2023, 3:38 PM

## 2023-02-15 NOTE — Telephone Encounter (Signed)
Pt made aware. States that she will discuss with him this afternoon at her appt.

## 2023-02-15 NOTE — Telephone Encounter (Signed)
Pt states that she will not take a statin drug and she will not let a insurance company dictate her health.  States that her mom died of liver failure and was on a statin. Asked patient if she was willing to pay out of pocket for Nexletol and she states that she does not care and that she is just not going to take a statin.

## 2023-02-15 NOTE — Telephone Encounter (Signed)
Cannot recall if the patient has never tried statin, no we tried Zetia.  Her insurance company is saying that she must of tried a statin in the past and had issue with to get Nexletol covered.  Please ask her this because I cannot recall.

## 2023-02-16 ENCOUNTER — Ambulatory Visit: Payer: Commercial Managed Care - PPO | Admitting: Family Medicine

## 2023-02-16 LAB — TSH: TSH: 18 u[IU]/mL — ABNORMAL HIGH (ref 0.450–4.500)

## 2023-02-17 ENCOUNTER — Other Ambulatory Visit: Payer: Self-pay | Admitting: Family Medicine

## 2023-02-17 MED ORDER — SYNTHROID 200 MCG PO TABS: 200.0000 ug | ORAL_TABLET | Freq: Every day | ORAL | 1 refills | Status: AC

## 2023-02-17 MED ORDER — THYROID 180 MG PO TABS
180.0000 mg | ORAL_TABLET | Freq: Every day | ORAL | 1 refills | Status: DC
Start: 2023-02-17 — End: 2023-02-17

## 2023-02-17 NOTE — Progress Notes (Signed)
Patient's thyroid was still too low, sent a higher dose of Armour Thyroid, 180 mg for her to the pharmacy.  I do not know what the price is going to be on this but if the price becomes an issue we can always switch her to levothyroxine    I sent brand Synthroid 200 mcg for her which should be close to equivalent of the Armour Thyroid dose that she was on and will have to retest in a couple months.  She has been intolerant previously of levothyroxine and has already tried Armour Thyroid so I thinks the Synthroid should be covered by her insurance but we will have to see at the pharmacy

## 2023-02-17 NOTE — Progress Notes (Signed)
Called patient and notified. She wants to know if she can/should just go back on the Synthroid. She also states that she can not levothyroxine. Please advise

## 2023-02-17 NOTE — Progress Notes (Signed)
Sent Synthroid for her

## 2023-02-22 ENCOUNTER — Telehealth: Payer: Self-pay | Admitting: Family Medicine

## 2023-02-22 NOTE — Telephone Encounter (Signed)
[employer] faxed FMLA forms to be completed and signed.  Form Fee Paid? (Y/N)       No     If NO, form is placed on front office manager desk to hold until payment received. If YES, then form will be placed in the RX/HH Nurse Coordinators box for completion.  Form will not be processed until payment is received   I called pt to inform her that FMLA fee must be paid before they will start on them. She said that she will call back tomorrow to pay.

## 2023-02-25 ENCOUNTER — Telehealth: Payer: Commercial Managed Care - PPO | Admitting: Family Medicine

## 2023-02-25 ENCOUNTER — Telehealth: Payer: Self-pay | Admitting: Family Medicine

## 2023-02-25 DIAGNOSIS — Z0279 Encounter for issue of other medical certificate: Secondary | ICD-10-CM

## 2023-02-25 NOTE — Telephone Encounter (Signed)
Information completed and forwarded to PCP 

## 2023-02-26 ENCOUNTER — Telehealth: Payer: Self-pay

## 2023-02-26 ENCOUNTER — Other Ambulatory Visit (HOSPITAL_COMMUNITY): Payer: Self-pay

## 2023-02-26 NOTE — Telephone Encounter (Signed)
PCP completed and signed FMLA forms. They have been faxed to [employer] at fax number [phone]. Patient has been contacted and informed they are complete, copy up front.

## 2023-02-26 NOTE — Telephone Encounter (Signed)
Pharmacy Patient Advocate Encounter   Received notification from CoverMyMeds that prior authorization for Synthroid tablets is required/requested.    PA submitted to [facility] via CoverMyMeds Key/confirmation #/[id number] Status is pending   Key: [id number]

## 2023-03-01 ENCOUNTER — Other Ambulatory Visit (HOSPITAL_COMMUNITY): Payer: Self-pay

## 2023-03-01 NOTE — Telephone Encounter (Signed)
Pharmacy Patient Advocate Encounter  Received notification from [facility] that Prior Authorization for Synthroid tablets has been APPROVED from 02-27-2023 to 02-25-2024.Marland Kitchen  PA #/Case ID/Reference #: : [id number] Key: [id number]  Copay is $100.00 for 30 day supply. Copay is $300.00 for 90 day supply.

## 2023-03-03 ENCOUNTER — Encounter (INDEPENDENT_AMBULATORY_CARE_PROVIDER_SITE_OTHER): Payer: Commercial Managed Care - PPO | Admitting: Internal Medicine

## 2023-03-18 ENCOUNTER — Ambulatory Visit: Payer: Commercial Managed Care - PPO | Admitting: Family Medicine

## 2023-04-21 ENCOUNTER — Ambulatory Visit: Payer: Commercial Managed Care - PPO | Admitting: Family Medicine

## 2023-04-22 ENCOUNTER — Encounter: Payer: Self-pay | Admitting: Family Medicine
